# Patient Record
Sex: Female | Born: 1945 | Hispanic: No | Marital: Single | Smoking: Former smoker
Health system: Southern US, Community
[De-identification: ages and names within clinical notes are randomized; demographics above are authoritative.]

## PROBLEM LIST (undated history)

## (undated) DIAGNOSIS — IMO0002 Reserved for concepts with insufficient information to code with codable children: Secondary | ICD-10-CM

## (undated) HISTORY — DX: Reserved for concepts with insufficient information to code with codable children: IMO0002

---

## 2001-04-20 ENCOUNTER — Emergency Department (HOSPITAL_COMMUNITY): Admission: EM | Admit: 2001-04-20 | Discharge: 2001-04-20 | Payer: Self-pay | Admitting: Emergency Medicine

## 2012-02-25 ENCOUNTER — Other Ambulatory Visit: Payer: Self-pay | Admitting: Obstetrics and Gynecology

## 2012-02-25 DIAGNOSIS — Z1231 Encounter for screening mammogram for malignant neoplasm of breast: Secondary | ICD-10-CM

## 2012-03-04 ENCOUNTER — Ambulatory Visit (INDEPENDENT_AMBULATORY_CARE_PROVIDER_SITE_OTHER): Payer: Self-pay | Admitting: *Deleted

## 2012-03-04 ENCOUNTER — Ambulatory Visit (HOSPITAL_COMMUNITY)
Admission: RE | Admit: 2012-03-04 | Discharge: 2012-03-04 | Disposition: A | Discharge status: Home / Self Care | Payer: Self-pay | Source: Ambulatory Visit | Attending: Obstetrics and Gynecology | Admitting: Obstetrics and Gynecology

## 2012-03-04 VITALS — BP 117/71 | HR 70 | Temp 97.1°F | Ht 60.5 in | Wt 145.2 lb

## 2012-03-04 DIAGNOSIS — Z1231 Encounter for screening mammogram for malignant neoplasm of breast: Secondary | ICD-10-CM

## 2012-03-04 DIAGNOSIS — Z01419 Encounter for gynecological examination (general) (routine) without abnormal findings: Secondary | ICD-10-CM

## 2012-03-04 NOTE — Patient Instructions (Signed)
Taught patient how to perform BSE. Let her know [facility] will cover Pap smears every 3 years unless has a history of abnormal Pap smears. Patient is escorted to mammography for a screening mammogram. Patient referred to the [facility] for for cystocele. Appointment scheduled April 04, 2012 at 1115. Gave patient appointment and patient stated will be there. Let patient know will follow up with her within the next couple weeks with results. Patient verbalized understanding.

## 2012-03-04 NOTE — Progress Notes (Signed)
No complaints today.  Pap Smear:    Pap smear completed today. Per patient thinks last Pap smear was at the free Pap smear screening at the [facility] last year and was normal. Per patient no history of abnormal Pap smears. No Pap smear results in EPIC.  Physical exam: Breasts Breasts symmetrical. No skin abnormalities bilateral breasts. No nipple retraction bilateral breasts. No nipple discharge bilateral breasts. No lymphadenopathy. No lumps palpated bilateral breasts. No complaints of pain or tenderness on exam.          Pelvic/Bimanual   Ext Genitalia No lesions, no swelling and no discharge observed on external genitalia. Cystocele observed that at times requires that patient push back in.         Vagina Vagina pink and normal texture. No lesions or discharge observed in vagina.          Cervix Cervix is present. Cervix pink and of normal texture. No discharge observed at cervical os.     Uterus Uterus is present and palpable. Patient has large cystocele that referred to the [facility] for follow up. Appointment scheduled for April 04, 2012 at 1115.        Adnexae Bilateral ovaries present and palpable. No tenderness on palpation.          Rectovaginal No rectal exam completed today since patient had no rectal complaints. No skin abnormalities observed on exam.

## 2012-03-14 ENCOUNTER — Telehealth: Payer: Self-pay | Admitting: *Deleted

## 2012-03-14 NOTE — Telephone Encounter (Signed)
Telephoned home # disconnected. Pt unaware of colpo scheduled for May 2 at 1:00

## 2012-03-31 ENCOUNTER — Encounter: Payer: Self-pay | Admitting: Obstetrics and Gynecology

## 2012-04-04 ENCOUNTER — Encounter: Payer: Self-pay | Admitting: Obstetrics & Gynecology

## 2012-04-04 ENCOUNTER — Ambulatory Visit (INDEPENDENT_AMBULATORY_CARE_PROVIDER_SITE_OTHER): Payer: Self-pay | Admitting: Obstetrics & Gynecology

## 2012-04-04 VITALS — BP 104/56 | HR 61 | Temp 98.6°F | Ht 61.0 in | Wt 144.5 lb

## 2012-04-04 DIAGNOSIS — N393 Stress incontinence (female) (male): Secondary | ICD-10-CM

## 2012-04-04 DIAGNOSIS — N812 Incomplete uterovaginal prolapse: Secondary | ICD-10-CM

## 2012-04-04 MED ORDER — ESTROGENS, CONJUGATED 0.625 MG/GM VA CREA: TOPICAL_CREAM | VAGINAL | Status: DC

## 2012-04-04 NOTE — Progress Notes (Signed)
  Subjective:    Patient ID: Tiffany Armstrong, female    DOB: 07-23-1946, 66 y.o.   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 She has not been sexually active for "years".  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 She will need vaginal estrogen 2 times per week prior to pessary placement.

## 2012-04-10 ENCOUNTER — Encounter: Payer: Self-pay | Admitting: Family Medicine

## 2012-04-15 ENCOUNTER — Telehealth: Payer: Self-pay | Admitting: *Deleted

## 2012-04-15 NOTE — Telephone Encounter (Signed)
Attempted to call patient with interpreter Delorise Royals since patient no showed for colposcopy appointment on 04/10/12. Rescheduled patients appointment for Thursday, Apr 17, 2012 at 1345. No one answered phone. Left message for patient to call me back.

## 2012-04-16 ENCOUNTER — Telehealth: Payer: Self-pay | Admitting: *Deleted

## 2012-04-16 NOTE — Telephone Encounter (Signed)
Interpreter Tiffany Armstrong was able to get in touch with patient and give her follow up appointment at the [facility] for colposcopy due to missing previous appointment. Patient verbalized understanding.

## 2012-04-17 ENCOUNTER — Encounter: Payer: Self-pay | Admitting: Physician Assistant

## 2012-04-17 ENCOUNTER — Ambulatory Visit (INDEPENDENT_AMBULATORY_CARE_PROVIDER_SITE_OTHER): Payer: Self-pay | Admitting: Physician Assistant

## 2012-04-17 VITALS — BP 114/59 | HR 59 | Temp 97.1°F | Ht 61.0 in | Wt 145.6 lb

## 2012-04-17 DIAGNOSIS — R87811 Vaginal high risk human papillomavirus (HPV) DNA test positive: Secondary | ICD-10-CM

## 2012-04-17 DIAGNOSIS — N8111 Cystocele, midline: Secondary | ICD-10-CM

## 2012-04-17 DIAGNOSIS — IMO0002 Reserved for concepts with insufficient information to code with codable children: Secondary | ICD-10-CM

## 2012-04-17 DIAGNOSIS — N816 Rectocele: Secondary | ICD-10-CM | POA: Insufficient documentation

## 2012-04-17 HISTORY — DX: Reserved for concepts with insufficient information to code with codable children: IMO0002

## 2012-04-17 NOTE — Progress Notes (Signed)
Pt presents from [facility] fro ASCUS pap with +HPV. Patient given informed consent, signed copy in the chart, time out was performed.  Placed in lithotomy position. Cervix viewed with speculum and colposcope after application of acetic acid.   Colposcopy adequate?  No [place]et[place]hite lesions?Yes Punctation?No Mosaicism?  No Abnormal vasculature?  No Biopsies?12 o'clock ECC?Yes  COMMENTS:CIN I/HPV Patient was given post procedure instructions.  She will return as scheduled on 5/31 with Dr. Marice Potter for pessary fitting and results.

## 2012-04-17 NOTE — Patient Instructions (Signed)
Colposcopa, Cuidado posterior (Colposcopy, Care After) La colposcopa es un procedimiento en el que se utiliza una herramienta especial para magnificar la superficie del cuello del tero. Tambin es posible que se tome una muestra de tejido (biopsia). Esta muestra se observar para identificar la presencia de cncer cervical u otros problemas. Despus del procedimiento:  Podr sentir algunos clicos.   Recustese algunos minutos si se siente mareada.   Podr tener un sangrado que debera detenerse luego de algunos das.  CUIDADOS EN EL HOGAR  No tenga relaciones sexuales ni use tampones durante 2 o 3 das o segn le hayan indicado.   Slo tome medicamentos como lo indique su mdico.   Contine tomando las pastillas anticonceptivas de la forma habitual.  Averige los resultados de su anlisis Pregunte cundo estarn listos los resultados del examen. Asegrese de obtener los resultados. SOLICITE AYUDA DE INMEDIATO SI:  Tiene un sangrado abundante o elimina cogulos.   Su temperatura es de 102 F (38.9 C) o mayor.   Observa una secrecin vaginal anormal.   Tiene clicos que no se van con los medicamentos.   Siente mareos, vrtigo o pierde el conocimiento (se desmaya).  ASEGRESE DE QUE:   Comprende estas instrucciones.   Controlar su enfermedad.   Solicitar ayuda de inmediato si no mejora o si empeora.  Document Released: 12/29/2010 Document Revised: 11/15/2011 Exit[employer] Patient Information 2012 [employer]. 

## 2012-05-09 ENCOUNTER — Ambulatory Visit (INDEPENDENT_AMBULATORY_CARE_PROVIDER_SITE_OTHER): Payer: Self-pay | Admitting: Obstetrics and Gynecology

## 2012-05-09 ENCOUNTER — Encounter: Payer: Self-pay | Admitting: Obstetrics and Gynecology

## 2012-05-09 VITALS — BP 121/71 | HR 66 | Temp 98.0°F | Resp 12 | Ht 61.0 in | Wt 145.4 lb

## 2012-05-09 DIAGNOSIS — N760 Acute vaginitis: Secondary | ICD-10-CM

## 2012-05-09 DIAGNOSIS — N8111 Cystocele, midline: Secondary | ICD-10-CM

## 2012-05-09 DIAGNOSIS — IMO0002 Reserved for concepts with insufficient information to code with codable children: Secondary | ICD-10-CM

## 2012-05-09 LAB — WET PREP, GENITAL

## 2012-05-09 NOTE — Progress Notes (Signed)
Addended by: Catalina Antigua on: 05/09/2012 09:44 AM   Modules accepted: Orders

## 2012-05-09 NOTE — Progress Notes (Signed)
Patient ID: Tiffany Armstrong, female   DOB: 26-Apr-1946, 66 y.o.   MRN: [id number]  66 yo G3P3 with 3rd degree uterine prolapse and cystocele presenting today for pessary fitting. Patient has been using estrogen cream twice weekly since her 5/6 visit.   A size 2 3/4 ring pessary with support was placed successfully without complication. Patient was able to void without difficulty and was able to remove and re-insert pessary. Patient advised to continue weekly estrogen cream application.  RTC in 1 month

## 2012-05-11 MED ORDER — METRONIDAZOLE 500 MG PO TABS: 500.0000 mg | ORAL_TABLET | Freq: Two times a day (BID) | ORAL | Status: AC

## 2012-05-11 NOTE — Progress Notes (Signed)
Addended by: Catalina Antigua on: 05/11/2012 08:52 AM   Modules accepted: Orders

## 2012-05-12 ENCOUNTER — Telehealth: Payer: Self-pay | Admitting: *Deleted

## 2012-05-12 NOTE — Telephone Encounter (Signed)
Pt states that since having her pessary put in she is having worsening incontinence. I advised patient to take the pessary out and I would check with Dr. Jolayne Panther to see what she should do. Pt agrees and will call back if her problem becomes worse before she hears from [place].

## 2012-05-12 NOTE — Telephone Encounter (Signed)
Message copied by Mannie Stabile on Mon May 12, 2012 11:56 AM ------      Message from: Catalina Antigua      Created: Sun May 11, 2012  8:47 AM       Please inform patient that flagyl has ben e-prescribed for treatment of BV            Peggy

## 2012-05-12 NOTE — Telephone Encounter (Signed)
Called pt with interpreter Delorise Royals. Informed of results, she will pick up her medicine at pharmacy.

## 2012-05-14 NOTE — Telephone Encounter (Signed)
Patient may need to consider surgical treatment of her incontinence. If this is something she desires, she needs to schedule an appointment with Dr. Marice Potter.  If worsening incontinence persists, patient should come in to give in a urine sample for urine culture to rule out UTI.  Tiffany Armstrong

## 2012-05-15 NOTE — Telephone Encounter (Signed)
Called patient with Tiffany Armstrong, no answer so we left her a message to call back. She will call Raynelle Fanning and then Raynelle Fanning will call us.

## 2012-05-19 NOTE — Telephone Encounter (Signed)
Tiffany Armstrong spoke with patient  She doesn't want to have surgery right now, she will call us if her problem persists.

## 2012-06-05 ENCOUNTER — Ambulatory Visit (INDEPENDENT_AMBULATORY_CARE_PROVIDER_SITE_OTHER): Payer: Self-pay | Admitting: Family

## 2012-06-05 VITALS — BP 116/67 | HR 74 | Temp 97.5°F | Ht 61.0 in | Wt 146.1 lb

## 2012-06-05 DIAGNOSIS — R32 Unspecified urinary incontinence: Secondary | ICD-10-CM

## 2012-06-05 DIAGNOSIS — N760 Acute vaginitis: Secondary | ICD-10-CM

## 2012-06-05 LAB — POCT URINALYSIS DIP (DEVICE)
Bilirubin Urine: NEGATIVE
Glucose, UA: NEGATIVE mg/dL
Hgb urine dipstick: NEGATIVE
Specific Gravity, Urine: 1.01 (ref 1.005–1.030)

## 2012-06-05 MED ORDER — LIDOCAINE HCL 2 % EX GEL: CUTANEOUS | Status: AC | PRN

## 2012-06-05 NOTE — Progress Notes (Signed)
  Subjective:    Patient ID: Tiffany Armstrong, female    DOB: 09-26-1946, 66 y.o.   MRN: [id number]  HPI Pt is here with report of vaginal burning.  Pt recently fitted for a pessary.  Reports continued incontinence, however, less than before.  Do to discomfort, uses pessary for two days and then removes for two days, feels this works.  Reports using the vaginal estrogen as directed.  Also used flagyl for BV.   Review of Systems  Genitourinary: Positive for vaginal pain (burning).       Incontinence  All other systems reviewed and are negative.       Objective:   Physical Exam  Constitutional: She is oriented to person, place, and time. She appears well-developed and well-nourished. No distress.  HENT:  Head: Normocephalic and atraumatic.  Neck: Normal range of motion. Neck supple.  Abdominal: Bowel sounds are normal.  Genitourinary: There is no lesion on the right labia. There is no lesion on the left labia. No erythema or bleeding around the vagina. No vaginal discharge found.       No ulceration seen within the vaginal canal  Neurological: She is alert and oriented to person, place, and time.  Skin: Skin is warm and dry.          Assessment & Plan:  Vaginitis  Plan: Urine culture Wet prep RX Lidocaine Gel 2% Follow-up if no improvement or worsening of symptoms [facility]

## 2012-06-06 LAB — WET PREP, GENITAL
Clue Cells Wet Prep HPF POC: NONE SEEN
Trich, Wet Prep: NONE SEEN

## 2012-06-07 LAB — URINE CULTURE: Colony Count: >=100000

## 2012-06-14 ENCOUNTER — Other Ambulatory Visit: Payer: Self-pay | Admitting: Family

## 2012-06-14 MED ORDER — AMPICILLIN 250 MG PO CAPS: 500.0000 mg | ORAL_CAPSULE | Freq: Three times a day (TID) | ORAL | Status: AC

## 2012-06-19 ENCOUNTER — Telehealth: Payer: Self-pay

## 2012-06-19 NOTE — Telephone Encounter (Signed)
Called pt with Spanish interpreter, Raynelle Fanning, and spoke with pt daughter and daughter was gave Raynelle Fanning # [phone] to contact pt.  Contacted pt @ [phone] and informed pt of UTI and that a Rx was sent to her pharmacy. Verified pharmacy and pt stated that she would go to pick up Rx today.

## 2012-06-19 NOTE — Telephone Encounter (Signed)
Message copied by Faythe Casa on Thu Jun 19, 2012  8:17 AM ------      Message from: Melissa Noon      Created: Wed Jun 18, 2012  4:40 PM      Regarding: FW: RX sent to [facility] for UTI       Can you notify pt regarding RX sent for UTI.                                                                                                                                                      ----- Message -----         From: Kendrick Ranch, MD         Sent: 06/18/2012   1:42 PM           To: Melissa Noon, CNM      Subject: RE: RX sent to [facility] for UTI              Not my pt. Sending back to you      ----- Message -----         From: Melissa Noon, CNM         Sent: 06/14/2012   3:02 AM           To: [facility]      Subject: RX sent to [facility] for UTI                  Can you notify pt regarding RX sent for UTI.                  ----- Message -----         From: [facility]         Sent: 06/06/2012   5:46 AM           To: Melissa Noon, CNM

## 2012-11-03 ENCOUNTER — Encounter (HOSPITAL_COMMUNITY): Payer: Self-pay | Admitting: *Deleted

## 2012-11-18 ENCOUNTER — Encounter (HOSPITAL_COMMUNITY): Payer: Self-pay

## 2012-11-18 ENCOUNTER — Ambulatory Visit (HOSPITAL_COMMUNITY)
Admission: RE | Admit: 2012-11-18 | Discharge: 2012-11-18 | Disposition: A | Discharge status: Home / Self Care | Payer: Self-pay | Source: Ambulatory Visit | Attending: Obstetrics and Gynecology | Admitting: Obstetrics and Gynecology

## 2012-11-18 VITALS — BP 120/72 | Temp 98.9°F | Ht 62.0 in | Wt 146.4 lb

## 2012-11-18 DIAGNOSIS — Z01419 Encounter for gynecological examination (general) (routine) without abnormal findings: Secondary | ICD-10-CM

## 2012-11-18 NOTE — Addendum Note (Signed)
Encounter addended by: Lurlean Horns, LPN on: 16/09/9603  1:27 PM<BR>     Documentation filed: Visit Diagnoses, Orders

## 2012-11-18 NOTE — Patient Instructions (Signed)
Taught patient how to perform BSE and gave educational materials to take home. Let patient know that her next Pap smear and follow up will be based on the result of her Pap smear today.  Let patient know will follow up with her within the next couple weeks with results with an interpreter. Let patient know that her next mammogram is due 03/04/2013 and that she will need to come back to [facility] if still eligible for it to be covered through [facility]. Patient verbalized understanding.

## 2012-11-18 NOTE — Progress Notes (Signed)
No complaints today. Patient came to [facility] for 6 month follow up Pap smear.  Pap Smear:    Completed Pap smear today. Last Pap smear was 03/04/2012 at [facility] and ASCUS HPV +. Patient had a follow up colposcopy 04/17/2012 that showed benign cervical mucosa. Per patient that is the only abnormal Pap smear that she has had. Pap smear result above is in EPIC.        Pelvic/Bimanual   Ext Genitalia No lesions, no swelling and no discharge observed on external genitalia.         Vagina Vagina pink and normal texture. No lesions or discharge observed in vagina.          Cervix Cervix is present. Cervix pink and of normal texture.     Uterus Uterus is present and palpable. Patient has a cystocele that has been followed up at the [facility].      Adnexae Bilateral ovaries present and palpable. No tenderness on palpation.        Rectovaginal No rectal exam completed today since patient had no rectal complaints. No skin abnormalities observed on exam.

## 2013-02-12 ENCOUNTER — Other Ambulatory Visit: Payer: Self-pay | Admitting: Obstetrics and Gynecology

## 2013-02-12 DIAGNOSIS — Z1231 Encounter for screening mammogram for malignant neoplasm of breast: Secondary | ICD-10-CM

## 2013-02-25 ENCOUNTER — Encounter (HOSPITAL_COMMUNITY): Payer: Self-pay | Admitting: *Deleted

## 2013-03-10 ENCOUNTER — Ambulatory Visit (HOSPITAL_COMMUNITY)
Admission: RE | Admit: 2013-03-10 | Discharge: 2013-03-10 | Disposition: A | Discharge status: Home / Self Care | Payer: Self-pay | Source: Ambulatory Visit | Attending: Obstetrics and Gynecology | Admitting: Obstetrics and Gynecology

## 2013-03-10 ENCOUNTER — Encounter (HOSPITAL_COMMUNITY): Payer: Self-pay

## 2013-03-10 VITALS — BP 110/70 | Temp 98.0°F | Ht 62.0 in | Wt 143.6 lb

## 2013-03-10 DIAGNOSIS — Z1231 Encounter for screening mammogram for malignant neoplasm of breast: Secondary | ICD-10-CM

## 2013-03-10 DIAGNOSIS — Z1239 Encounter for other screening for malignant neoplasm of breast: Secondary | ICD-10-CM

## 2013-03-10 NOTE — Progress Notes (Signed)
No complaints today.  Pap Smear:    Pap smear not completed today. Last Pap smear was 11/18/2012 at [facility] and normal. Patient has a history of an abnormal Pap smear 03/04/2012 that was ASCUS HPV +. Patient had a colposcopy to follow up 04/17/2012 that did not show any dysplasia or malignancy. Pap smear and colposcopy results above is in EPIC.  Physical exam: Breasts Breasts symmetrical. No skin abnormalities bilateral breasts. No nipple retraction bilateral breasts. No nipple discharge bilateral breasts. No lymphadenopathy. No lumps palpated bilateral breasts. No complaints of pain or tenderness on exam. Patient escorted to mammography for a screening mammogram.        Pelvic/Bimanual No Pap smear completed today since last Pap smear was 11/18/2012. Pap smear not indicated per [facility] guidelines.

## 2013-03-10 NOTE — Patient Instructions (Signed)
Taught patient how to perform BSE. Patient did not need a Pap smear today due to last Pap smear was November 18, 2012. Told patient will need next Pap smear December 2014 since had an abnormal Pap smear March 2013. Told patient after 3 normal Pap smears in row that will need Pap smears every 3 years per [facility] and ACOG guidelines. Let patient know will follow up with her within the next couple weeks with results by letter or phone. Patient verbalized understanding. Patient escorted to mammography for a screening mammogram.

## 2013-03-27 ENCOUNTER — Emergency Department (HOSPITAL_COMMUNITY)
Admission: EM | Admit: 2013-03-27 | Discharge: 2013-03-27 | Disposition: A | Discharge status: Home Health | Payer: No Typology Code available for payment source | Source: Home / Self Care | Attending: Emergency Medicine | Admitting: Emergency Medicine

## 2013-03-27 ENCOUNTER — Encounter (HOSPITAL_COMMUNITY): Payer: Self-pay

## 2013-03-27 DIAGNOSIS — S025XXS Fracture of tooth (traumatic), sequela: Secondary | ICD-10-CM

## 2013-03-27 DIAGNOSIS — S0291XS Unspecified fracture of skull, sequela: Secondary | ICD-10-CM

## 2013-03-27 MED ORDER — IBUPROFEN 200 MG PO TABS: 600.0000 mg | ORAL_TABLET | Freq: Three times a day (TID) | ORAL | Status: DC | PRN

## 2013-03-27 MED ORDER — CLINDAMYCIN HCL 300 MG PO CAPS: 300.0000 mg | ORAL_CAPSULE | Freq: Three times a day (TID) | ORAL | Status: DC

## 2013-03-27 NOTE — Progress Notes (Signed)
Patient Demographics  Tiffany Armstrong, is a 67 y.o. female  ZOX:[id number]  WJX:[id number]  DOB - 1946/03/07  Chief Complaint  Patient presents with  . Dental Pain        Subjective:   Tiffany Armstrong today is here for evaluation following a left incision at the fracture and subsequent swelling and pain of her left gingival area. Patient has No headache, No chest pain, No abdominal pain - No Nausea, No new weakness tingling or numbness, No Cough - SOB. Denies any fever  Objective:    Filed Vitals:   03/27/13 1609  BP: 122/59  Pulse: 77  Temp: 97.6 F (36.4 C)  TempSrc: Oral  Resp: 18  SpO2: 98%     ALLERGIES:  No Known Allergies  PAST MEDICAL HISTORY: Past Medical History  Diagnosis Date  . Cystocele 04/17/2012  . 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 No pertinent past surgical history.  FAMILY HISTORY: Family History  Problem Relation Age of Onset  . Asthma Mother     MEDICATIONS AT HOME: Prior to Admission medications   Medication Sig Start Date End Date Taking? Authorizing Provider  clindamycin (CLEOCIN) 300 MG capsule Take 1 capsule (300 mg total) by mouth 3 (three) times daily. 03/27/13   Shanker Levora Dredge, MD  conjugated estrogens (PREMARIN) vaginal cream 1 gram per vagina 2 times per week 04/04/12   Allie Bossier, MD  ibuprofen (ADVIL) 200 MG tablet Take 3 tablets (600 mg total) by mouth every 8 (eight) hours as needed for pain. 03/27/13   Shanker Levora Dredge, MD  lidocaine (XYLOCAINE) 2 % jelly Apply topically as needed. 06/05/12 06/05/13  Melissa Noon, CNM  Multiple Vitamin (MULTIVITAMIN) capsule Take 1 capsule by mouth daily.    Historical Provider, MD    REVIEW OF SYSTEMS:  Constitutional:   No   Fevers, chills, fatigue.  HEENT:    No headaches, Sore throat,   Cardio-vascular: No chest pain,  Orthopnea, swelling in lower extremities, anasarca, palpitations  GI:  No  abdominal pain, nausea, vomiting, diarrhea  Resp: No shortness of breath,  No coughing up of blood.No cough.No wheezing.  Skin:  no rash or lesions.  GU:  no dysuria, change in color of urine, no urgency or frequency.  No flank pain.  Musculoskeletal: No joint pain or swelling.  No decreased range of motion.  No back pain.  Psych: No change in mood or affect. No depression or anxiety.  No memory loss.   Exam  General appearance :Awake, alert, not in any distress. Speech Clear. Not toxic Looking HEENT: Atraumatic and Normocephalic, pupils equally reactive to light and accomodation Neck: supple, no JVD. No cervical lymphadenopathy.  Chest:Good air entry bilaterally, no added sounds  CVS: S1 S2 regular, no murmurs.  Abdomen: Bowel sounds present, Non tender and not distended with no gaurding, rigidity or rebound. Extremities: B/L Lower Ext shows no edema, both legs are warm to touch Neurology: Awake alert, and oriented X 3, CN II-XII intact, Non focal Skin:No Rash Wounds:N/A    Data Review   CBC No results found for this basename: WBC, HGB, HCT, PLT, MCV, MCH, MCHC, RDW, NEUTRABS, LYMPHSABS, MONOABS, EOSABS, BASOSABS, BANDABS, BANDSABD,  in the last 168 hours  Chemistries   No results found for this basename: NA, K, CL, CO2, GLUCOSE, BUN, CREATININE, GFRCGP, CALCIUM, MG, AST, ALT, ALKPHOS, BILITOT,  in the last 168 hours ------------------------------------------------------------------------------------------------------------------ No results  found for this basename: HGBA1C,  in the last 72 hours ------------------------------------------------------------------------------------------------------------------ No results found for this basename: CHOL, HDL, LDLCALC, TRIG, CHOLHDL, LDLDIRECT,  in the last 72 hours ------------------------------------------------------------------------------------------------------------------ No results found for this basename: TSH,  T4TOTAL, FREET3, T3FREE, THYROIDAB,  in the last 72 hours ------------------------------------------------------------------------------------------------------------------ No results found for this basename: VITAMINB12, FOLATE, FERRITIN, TIBC, IRON, RETICCTPCT,  in the last 72 hours  Coagulation profile  No results found for this basename: INR, PROTIME,  in the last 168 hours    Assessment & Plan   1. Left incisor tooth fracture/gingivitis - Some swelling in tenderness in the left gingival area. - Will start empiric clindamycin and ibuprofen - Referral to the dental clinic- sooner the better-RN aware that patient needs referral  Patient in follow with [place] when necessary  Follow-up Information   Schedule an appointment as soon as possible for a visit with [facility]. (As needed)

## 2013-03-27 NOTE — ED Notes (Signed)
Patient complains of having gum pain Needs dental referral

## 2013-03-27 NOTE — ED Notes (Signed)
Referral faxed to [facility] 

## 2013-07-23 ENCOUNTER — Ambulatory Visit: Payer: No Typology Code available for payment source | Attending: Family Medicine | Admitting: Internal Medicine

## 2013-07-23 ENCOUNTER — Encounter: Payer: Self-pay | Admitting: Internal Medicine

## 2013-07-23 VITALS — BP 105/69 | HR 83 | Temp 98.6°F | Resp 16 | Ht 61.0 in | Wt 132.2 lb

## 2013-07-23 DIAGNOSIS — Z7189 Other specified counseling: Secondary | ICD-10-CM

## 2013-07-23 DIAGNOSIS — Z7689 Persons encountering health services in other specified circumstances: Secondary | ICD-10-CM

## 2013-07-23 DIAGNOSIS — Z01419 Encounter for gynecological examination (general) (routine) without abnormal findings: Secondary | ICD-10-CM | POA: Insufficient documentation

## 2013-07-23 LAB — CBC WITH DIFFERENTIAL/PLATELET
Basophils Absolute: 0 10*3/uL (ref 0.0–0.1)
HCT: 37.7 % (ref 36.0–46.0)
Lymphocytes Relative: 40 % (ref 12–46)
Monocytes Absolute: 0.4 10*3/uL (ref 0.1–1.0)
Neutro Abs: 2.9 10*3/uL (ref 1.7–7.7)
Neutrophils Relative %: 50 % (ref 43–77)
Platelets: 351 10*3/uL (ref 150–400)
RDW: 13.9 % (ref 11.5–15.5)
WBC: 5.8 10*3/uL (ref 4.0–10.5)

## 2013-07-23 MED ORDER — ESOMEPRAZOLE MAGNESIUM 40 MG PO CPDR: 40.0000 mg | DELAYED_RELEASE_CAPSULE | Freq: Every day | ORAL | Status: DC

## 2013-07-23 NOTE — Progress Notes (Signed)
PT HERE TO ESTABLISH CARE  NO KNOWN MEDICAL HX NOTED NEED ANNUAL PHYSICAL/BLOOD WORK C/O MILD R BUTTOCK ACHY PAIN SPEAKS ONLY SPANISH.INTEPRETOR PRESENT

## 2013-07-23 NOTE — Progress Notes (Signed)
Patient ID: Tiffany Armstrong, female   DOB: 11-12-1946, 67 y.o.   MRN: [id number]  CC:  HPI: 66 year old female who presents to establish care. Patient has a history of abnormal Pap smear 03/04/2012 that was ASCUS HPV +. Patient had a colposcopy to follow up 04/17/2012 that did not show any dysplasia or malignancy.  last Pap smear was 11/18/2012. Pap smear not indicated per [facility] guidelines until end of the year She denies any chest pain any shortness of breath any recent weight loss or weight gain. She was to receive age-appropriate screening She states that she had a mammogram done this year her on 02/12/13. It was negative.  She has never had a colonoscopy   No Known Allergies Past Medical History  Diagnosis Date  . Cystocele 04/17/2012  . 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 Multiple Vitamin (MULTIVITAMIN) capsule Take 1 capsule by mouth daily.      . clindamycin (CLEOCIN) 300 MG capsule Take 1 capsule (300 mg total) by mouth 3 (three) times daily.  15 capsule  0  . conjugated estrogens (PREMARIN) vaginal cream 1 gram per vagina 2 times per week  42.5 g  12  . ibuprofen (ADVIL) 200 MG tablet Take 3 tablets (600 mg total) by mouth every 8 (eight) hours as needed for pain.  30 tablet  0   No current facility-administered medications on file prior to visit.   Family History  Problem Relation Age of Onset  . Asthma Mother   . Alcohol abuse Father   . Heart disease Maternal Grandmother    History   Social History  . Marital Status: Single    Spouse Name: N/A    Number of Children: N/A  . Years of Education: N/A   Occupational History  . Not on file.   Social History Main Topics  . Smoking status: Former Smoker    Types: Cigarettes    Quit date: 07/24/1999  . Smokeless tobacco: Not on file  . Alcohol Use: No  . Drug Use: No  . Sexual Activity: Not Currently   Birth Control/ Protection: None   Other Topics Concern  . Not on file   Social History Narrative  . No narrative on file    Review of Systems  Constitutional: Negative for fever, chills, diaphoresis, activity change, appetite change and fatigue.  HENT: Negative for ear pain, nosebleeds, congestion, facial swelling, rhinorrhea, neck pain, neck stiffness and ear discharge.   Eyes: Negative for pain, discharge, redness, itching and visual disturbance.  Respiratory: Negative for cough, choking, chest tightness, shortness of breath, wheezing and stridor.   Cardiovascular: Negative for chest pain, palpitations and leg swelling.  Gastrointestinal: Negative for abdominal distention.  Genitourinary: Negative for dysuria, urgency, frequency, hematuria, flank pain, decreased urine volume, difficulty urinating and dyspareunia.  Musculoskeletal: Negative for back pain, joint swelling, arthralgias and gait problem.  Neurological: Negative for dizziness, tremors, seizures, syncope, facial asymmetry, speech difficulty, weakness, light-headedness, numbness and headaches.  Hematological: Negative for adenopathy. Does not bruise/bleed easily.  Psychiatric/Behavioral: Negative for hallucinations, behavioral problems, confusion, dysphoric mood, decreased concentration and agitation.    Objective:   Filed Vitals:   07/23/13 1702  BP: 105/69  Pulse: 83  Temp: 98.6 F (37 C)  Resp: 16    Physical Exam  Constitutional: Appears well-developed and well-nourished. No distress.  HENT: Normocephalic. External right and left ear  normal. Oropharynx is clear and moist.  Eyes: Conjunctivae and EOM are normal. PERRLA, no scleral icterus.  Neck: Normal ROM. Neck supple. No JVD. No tracheal deviation. No thyromegaly.  CVS: RRR, S1/S2 +, no murmurs, no gallops, no carotid bruit.  Pulmonary: Effort and breath sounds normal, no stridor, rhonchi, wheezes, rales.  Abdominal: Soft. BS +,  no distension, tenderness,  rebound or guarding.  Musculoskeletal: Normal range of motion. No edema and no tenderness.  Lymphadenopathy: No lymphadenopathy noted, cervical, inguinal. Neuro: Alert. Normal reflexes, muscle tone coordination. No cranial nerve deficit. Skin: Skin is warm and dry. No rash noted. Not diaphoretic. No erythema. No pallor.  Psychiatric: Normal mood and affect. Behavior, judgment, thought content normal.   No results found for this basename: WBC, HGB, HCT, MCV, PLT   No results found for this basename: CREATININE, BUN, NA, K, CL, CO2    No results found for this basename: HGBA1C   Lipid Panel  No results found for this basename: chol, trig, hdl, cholhdl, vldl, ldlcalc       Assessment and plan:   Patient Active Problem List   Diagnosis Date Noted  . Cystocele 04/17/2012  . Rectocele 04/17/2012  . 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

## 2013-07-24 LAB — COMPREHENSIVE METABOLIC PANEL
ALT: 11 U/L (ref 0–35)
AST: 14 U/L (ref 0–37)
Albumin: 4.5 g/dL (ref 3.5–5.2)
Calcium: 9.4 mg/dL (ref 8.4–10.5)
Chloride: 101 mEq/L (ref 96–112)
Potassium: 4.1 mEq/L (ref 3.5–5.3)
Sodium: 137 mEq/L (ref 135–145)
Total Protein: 7.3 g/dL (ref 6.0–8.3)

## 2013-07-24 LAB — LIPID PANEL
LDL Cholesterol: 153 mg/dL — ABNORMAL HIGH (ref 0–99)
VLDL: 18 mg/dL (ref 0–40)

## 2013-07-24 LAB — HEMOGLOBIN A1C: Mean Plasma Glucose: 111 mg/dL (ref <117)

## 2013-08-06 ENCOUNTER — Ambulatory Visit: Payer: No Typology Code available for payment source | Attending: Internal Medicine | Admitting: Internal Medicine

## 2013-08-06 NOTE — Progress Notes (Signed)
PT HERE FOR LAB RESULTS

## 2013-08-31 ENCOUNTER — Ambulatory Visit: Payer: No Typology Code available for payment source | Attending: Internal Medicine

## 2013-08-31 VITALS — BP 123/84 | HR 67 | Temp 98.5°F | Resp 14

## 2013-08-31 DIAGNOSIS — Z23 Encounter for immunization: Secondary | ICD-10-CM

## 2013-08-31 DIAGNOSIS — Z09 Encounter for follow-up examination after completed treatment for conditions other than malignant neoplasm: Secondary | ICD-10-CM | POA: Insufficient documentation

## 2013-08-31 DIAGNOSIS — Z299 Encounter for prophylactic measures, unspecified: Secondary | ICD-10-CM

## 2013-08-31 NOTE — Progress Notes (Unsigned)
Pt here for flu vaccine. Denies allergies. Instructions given

## 2013-12-01 ENCOUNTER — Ambulatory Visit (HOSPITAL_COMMUNITY): Payer: Self-pay

## 2013-12-22 ENCOUNTER — Ambulatory Visit (HOSPITAL_COMMUNITY)
Admission: RE | Admit: 2013-12-22 | Discharge: 2013-12-22 | Disposition: A | Discharge status: Home / Self Care | Payer: Self-pay | Source: Ambulatory Visit | Attending: Obstetrics and Gynecology | Admitting: Obstetrics and Gynecology

## 2013-12-22 ENCOUNTER — Encounter (INDEPENDENT_AMBULATORY_CARE_PROVIDER_SITE_OTHER): Payer: Self-pay

## 2013-12-22 ENCOUNTER — Other Ambulatory Visit: Payer: Self-pay | Admitting: Obstetrics and Gynecology

## 2013-12-22 ENCOUNTER — Encounter (HOSPITAL_COMMUNITY): Payer: Self-pay

## 2013-12-22 VITALS — BP 102/60 | Temp 98.0°F | Ht 61.0 in | Wt 128.4 lb

## 2013-12-22 DIAGNOSIS — Z01419 Encounter for gynecological examination (general) (routine) without abnormal findings: Secondary | ICD-10-CM

## 2013-12-22 DIAGNOSIS — N898 Other specified noninflammatory disorders of vagina: Secondary | ICD-10-CM

## 2013-12-22 NOTE — Addendum Note (Signed)
Encounter addended by: Lurlean HornsSabrina Bryan Goin Addysin Porco, LPN on: 4/01/02721/13/2015  3:01 PM<BR>     Documentation filed: Visit Diagnoses, Orders

## 2013-12-22 NOTE — Patient Instructions (Signed)
Informed patient that if her Pap smear today is normal that her next Pap smear will be due in 1 year due to her history of an abnormal Pap smear March 2013 and this is her second Pap smear since. Patient has an uterine prolapse. Offered to refer patient for follow up but patient refuses to follow up at this time. Let patient know will follow up with her within the next couple weeks with results of wet prep and Pap smear by letter or phone. Tiffany Armstrong verbalized understanding.  Fusako Tanabe, Kathaleen Maserhristine Poll, RN @T @ 10:51 AM

## 2013-12-22 NOTE — Progress Notes (Signed)
Complained of vaginal discharge and odor.  Pap Smear:  Pap smear completed today. [facility]18[facility] and normal. Patient has a history of an abnormal Pap smear 03/04/2012 that was ASCUS HPV +. Patient had a colposcopy to follow up 04/17/2012 that did not show any dysplasia or malignancy. Pap smear and colposcopy results above are in EPIC.    Pelvic/Bimanual   Ext Genitalia No lesions, no swelling and no discharge observed on external genitalia. Cystocele observed that at times requires that patient push back in.   Vagina  Vagina pink and normal texture. No lesions and small amount of yellow colored discharge observed in vagina. Wet prep completed.  Cervix  Cervix is present. Cervix pink and of normal texture. Small amount of yellow colored discharge observed at cervical os.   Uterus  Uterus is present and palpable. Patient has large cystocele are refuses to follow up at this time.  Adnexae  Bilateral ovaries present and palpable. No tenderness on palpation.   Rectovaginal  No rectal exam completed today since patient had no rectal complaints. No skin abnormalities observed on exam.

## 2013-12-23 LAB — WET PREP, GENITAL
CLUE CELLS WET PREP: NONE SEEN
TRICH WET PREP: NONE SEEN
Yeast Wet Prep HPF POC: NONE SEEN

## 2013-12-24 ENCOUNTER — Telehealth (HOSPITAL_COMMUNITY): Payer: Self-pay | Admitting: *Deleted

## 2013-12-24 NOTE — Telephone Encounter (Signed)
Telephoned patient at home # and discussed negative pap smear results. Next pap smear due in one year. Wet prep was also normal. Patient voiced understanding. Used interpreter Delorise RoyalsJulie Sowell.

## 2014-03-17 ENCOUNTER — Emergency Department (HOSPITAL_COMMUNITY): Payer: Self-pay

## 2014-03-17 DIAGNOSIS — J069 Acute upper respiratory infection, unspecified: Secondary | ICD-10-CM | POA: Insufficient documentation

## 2014-03-17 DIAGNOSIS — H9190 Unspecified hearing loss, unspecified ear: Secondary | ICD-10-CM | POA: Insufficient documentation

## 2014-03-17 DIAGNOSIS — Z87891 Personal history of nicotine dependence: Secondary | ICD-10-CM | POA: Insufficient documentation

## 2014-03-17 DIAGNOSIS — H9209 Otalgia, unspecified ear: Secondary | ICD-10-CM | POA: Insufficient documentation

## 2014-03-17 DIAGNOSIS — J329 Chronic sinusitis, unspecified: Secondary | ICD-10-CM | POA: Insufficient documentation

## 2014-03-17 DIAGNOSIS — Z8742 Personal history of other diseases of the female genital tract: Secondary | ICD-10-CM | POA: Insufficient documentation

## 2014-03-17 DIAGNOSIS — H9210 Otorrhea, unspecified ear: Secondary | ICD-10-CM | POA: Insufficient documentation

## 2014-03-17 LAB — CBC WITH DIFFERENTIAL/PLATELET
Basophils Absolute: 0 10*3/uL (ref 0.0–0.1)
Basophils Relative: 0 % (ref 0–1)
EOS PCT: 1 % (ref 0–5)
Eosinophils Absolute: 0.2 10*3/uL (ref 0.0–0.7)
HEMATOCRIT: 37.1 % (ref 36.0–46.0)
Hemoglobin: 13 g/dL (ref 12.0–15.0)
LYMPHS ABS: 1.7 10*3/uL (ref 0.7–4.0)
LYMPHS PCT: 15 % (ref 12–46)
MCH: 33 pg (ref 26.0–34.0)
MCHC: 35 g/dL (ref 30.0–36.0)
MCV: 94.2 fL (ref 78.0–100.0)
MONO ABS: 1.1 10*3/uL — AB (ref 0.1–1.0)
Monocytes Relative: 10 % (ref 3–12)
Neutro Abs: 8.1 10*3/uL — ABNORMAL HIGH (ref 1.7–7.7)
Neutrophils Relative %: 74 % (ref 43–77)
Platelets: 366 10*3/uL (ref 150–400)
RBC: 3.94 MIL/uL (ref 3.87–5.11)
RDW: 13.4 % (ref 11.5–15.5)
WBC: 11.1 10*3/uL — AB (ref 4.0–10.5)

## 2014-03-17 LAB — COMPREHENSIVE METABOLIC PANEL
ALBUMIN: 3.3 g/dL — AB (ref 3.5–5.2)
ALK PHOS: 76 U/L (ref 39–117)
ALT: 8 U/L (ref 0–35)
AST: 17 U/L (ref 0–37)
BUN: 9 mg/dL (ref 6–23)
CO2: 27 mEq/L (ref 19–32)
Calcium: 9.1 mg/dL (ref 8.4–10.5)
Chloride: 101 mEq/L (ref 96–112)
Creatinine, Ser: 0.65 mg/dL (ref 0.50–1.10)
GFR calc Af Amer: >90 mL/min (ref >90)
GFR calc non Af Amer: >90 mL/min (ref >90)
Glucose, Bld: 110 mg/dL — ABNORMAL HIGH (ref 70–99)
POTASSIUM: 4.6 meq/L (ref 3.7–5.3)
SODIUM: 141 meq/L (ref 137–147)
TOTAL PROTEIN: 7.8 g/dL (ref 6.0–8.3)
Total Bilirubin: 0.4 mg/dL (ref 0.3–1.2)

## 2014-03-17 NOTE — ED Notes (Signed)
Pt c/o cold symptoms for two weeks but symptoms got really bad on Monday. Pt and son at bedside states she blew her nose, ears popped, now she has drainage from both ears. Pt reports decrease in fluid and food intake. Pt denies n/v/d and cough.

## 2014-03-18 ENCOUNTER — Encounter (HOSPITAL_COMMUNITY): Payer: Self-pay | Admitting: Emergency Medicine

## 2014-03-18 ENCOUNTER — Emergency Department (HOSPITAL_COMMUNITY)
Admission: EM | Admit: 2014-03-18 | Discharge: 2014-03-18 | Disposition: A | Discharge status: Home / Self Care | Payer: Self-pay | Attending: Emergency Medicine | Admitting: Emergency Medicine

## 2014-03-18 DIAGNOSIS — H9209 Otalgia, unspecified ear: Secondary | ICD-10-CM

## 2014-03-18 DIAGNOSIS — J329 Chronic sinusitis, unspecified: Secondary | ICD-10-CM

## 2014-03-18 DIAGNOSIS — J069 Acute upper respiratory infection, unspecified: Secondary | ICD-10-CM

## 2014-03-18 DIAGNOSIS — H9210 Otorrhea, unspecified ear: Secondary | ICD-10-CM

## 2014-03-18 MED ORDER — OXYMETAZOLINE HCL 0.05 % NA SOLN
1.0000 | Freq: Two times a day (BID) | NASAL | Status: DC
Start: 2014-03-18 — End: 2016-07-29

## 2014-03-18 MED ORDER — ACETAMINOPHEN-CODEINE #3 300-30 MG PO TABS
1.0000 | ORAL_TABLET | Freq: Once | ORAL | Status: AC
  Administered 2014-03-18: 1 via ORAL
  Filled 2014-03-18: qty 1

## 2014-03-18 MED ORDER — ANTIPYRINE-BENZOCAINE 5.4-1.4 % OT SOLN: 3.0000 [drp] | OTIC | Status: DC | PRN

## 2014-03-18 MED ORDER — IBUPROFEN 400 MG PO TABS: 400.0000 mg | ORAL_TABLET | Freq: Four times a day (QID) | ORAL | Status: DC | PRN

## 2014-03-18 MED ORDER — AMOXICILLIN 500 MG PO CAPS: 500.0000 mg | ORAL_CAPSULE | Freq: Three times a day (TID) | ORAL | Status: DC

## 2014-03-18 NOTE — Discharge Instructions (Signed)
Please take the medications prescribed for ear pain, sinus congestion and headaches. Antibiotics should help as well. Return to the ER if the symptoms get worse.   Otalgia (Otalgia) Usted o su nio sienten dolor en el odo. La causa ms frecuente es la infeccin del odo medio. El dolor aparece debido a la acumulacin de lquido y a la presin detrs del tmpano. El dolor puede ser agudo, sordo o intenso. Puede ser transitorio o constante. El odo medio est conectado a los conductos nasales por un corto tubo denominado trompa de [place]. Las trompas de EcolabEustaquio permiten que el lquido drene Portugalhacia afuera del odo medio y [place] Grenadinesayuda a Pharmacologistmantener equilibrada la presin del odo . CAUSAS Un resfro o alergia pueden bloquear las trompas de [place]ustaquio debido a la inflamacin y a Haematologistla acumulacin de Administratorsecreciones. Esto es especialmente probable en los nios pequeos debido a que sus trompas son ms cortas y se Ambulance personencuentran en una posicin ms horizontal. Cuando las trompas de MasuryEustaquio se obstruyen, se detiene el flujo normal de lquido que proviene del odo. El lquido se acumula y causa rigidez, Engineer, miningdolor, prdida [place] Kitt[place] and Nevisauditiva e infeccin si desarrollan grmenes. SNTOMAS Los sntomas de infeccin en el odo son Grant Rutsfiebre, dolor, nerviosismo, aumento del llanto e irritabilidad. Muchos nios sufrirn una prdida [place] Kitts and Nevisauditiva menor y temporaria durante la infeccin e inmediatamente despus. La prdida Constellation Energyauditiva permanente no es frecuente pero el riesgo aumenta si el nio sufre muchas infecciones. Otra de las causas es la retencin de agua en el canal auditivo externo debido a la natacin o al bao. En los adultos es menos probable que la causa del dolor sea una infeccin. El dolor de odos puede provenir de otras zonas. En algunos casos existe un problema en la articulacin que se encuentra entre la [place] Grovemandbula y el crneo. Tambin puede provenir de los dientes o el cuello. Otras causas son:  Cuerpo extrao en el odo.  Infeccin en  el odo externo.  Sinusitis.  Tapn de cera.  Traumatismo.  Artritis de Architectural technologistla mandbula o trastornos de la articulacin temporomandibular.  Infeccin en el odo medio  Infecciones dentales.  Dolor de garganta con dolor de odos. DIAGNSTICO Generalmente el profesional realiza el diagnstico a travs de Nurse, learning disabilityun examen. En algunos casos ser necesario realizar estudios especiales, como radiografas o Point Blankanlisis. TRATAMIENTO  Si le han prescripto antibiticos, selos segn las indicaciones y tmelos hasta completarlos an si los sntomas parecen haber mejorado.  En algunos nios ser necesario colocar tubos de ecualizacin de presin. stos tubos son pequeos conductos plsticos que se colocan en el tmpano en un procedimiento quirrgico simple. Ellos permiten que el lquido drene ms fcilmente y Regulatory affairs officerecualizar la presin del odo medio. Como consecuencia se Printmakeralivia el dolor ocasionado por los cambios de presin. INSTRUCCIONES PARA EL CUIDADO DOMICILIARIO  Utilice los medicamentos de venta libre o de prescripcin para Chief Technology Officerel dolor, el malestar o la Ridgewayfiebre, segn se lo indique el profesional que lo asiste. NO LE ADMINISTRE ASPIRINA A SU NIO porque existe el riesgo de contraer el [place].  Aplique compresas frias en el odo externo durante 15 a 20 minutos, 3 a 4 veces por da, o segn lo necesite para Engineer, materialsaliviar el dolor. No aplique hielo directamente sobre la piel. Puede congelar la piel.  Las gotas de venta libre utilizadas segn las indicaciones pueden ser efectivas. En algunos Nurse, adultcasos el profesional le prescribir gotas ticas.  Descansar en posicin erguida puede ayudar a reducir la presin en el odo medio y a Engineer, materialsaliviar el dolor.  El dolor de odos causado por un rpido descenso desde altitudes elevadas puede aliviarse tragando o [employer]. Haga que el beb succione la mamadera durante los viajes en avin.  No fume dentro de la casa o cerca de los nios. Si no puede abandonar el hbito, fume  en el exterior.  Controle las [place]. SOLICITE ATENCIN MDICA DE INMEDIATO SI:  Usted o el nio se sienten enfermos.  No consigue Engineer, materials con la medicacin.  Sus sntomas o los del nio (dolor, fiebre o irritabilidad) no mejoran dentro de 24 a 48 hs o segn las indicaciones.  Siente un dolor intenso y se detiene bruscamente. Esto puede indicar la ruptura del tmpano.  Usted o el nio desarrollan nuevos problemas, como dolor de cabeza intenso, rigidez en el cuello, dificultad para tragar o hinchazn del rostro o en la zona que rodea el odo. Document Released: 03/04/2008 Document Revised: 02/18/2012 [facility] Patient Information 2014 [place], [place].  Sinusitis  (Sinusitis) La sinusitis es el enrojecimiento, sensibilidad e hinchazn (inflamacin) de los senos paranasales. Los senos paranasales son bolsas de aire que se encuentran dentro de los huesos de la cara (por debajo de los ojos, en la mitad de la frente o por encima de los ojos). En los senos paranasales sanos, el moco puede drenar y el aire circula a travs de ellos en su camino hacia la Clinical cytogeneticist. Sin embargo, cuando se [place], el moco y el aire [place]. Esto hace que se desarrollen bacterias y otros grmenes y originen una infeccin.   La sinusitis puede desarrollarse rpidamente y durar slo un tiempo corto (aguda) o continuar por un perodo largo (crnica). La sinusitis que dura ms de 12 semanas se considera crnica.  CAUSAS  Las causas de la sinusitis son:   Development worker, community estructurales, como el desplazamiento del cartlago que separa las fosas nasales (desvo del tabique) pueden disminuir el flujo de aire por la nariz y los senos paranasales y Audiological scientist su drenaje.  Las alteraciones funcionales, como cuando los pequeos pelos (cilias) que se encuentran en los senos nasales y que ayudan a eliminar la mucosidad no funcionan correctamente o no estn presentes. SNTOMAS  Los sntomas de sinusitis aguda  y crnica son los mismos. Los sntomas principales son Chief Technology Officer y la presin alrededor de los senos paranasales afectados. Otros sntomas son:   Careers information officer.  Dolor de odos.  Dolor de [place].  Mal aliento.  Disminucin del sentido del olfato y del gusto.  Tos, que empeora al [employer]  Fatiga.  Grant Ruts.  Drenaje de moco espeso por la nariz, que generalmente es de color verde y puede contener pus (purulento).  Hinchazn y calor en los senos paranasales afectados. DIAGNSTICO  El mdico le har un examen fsico. Durante el examen, el mdico:   Revisar su nariz buscando signos de crecimientos anormales en las fosas nasales (plipos nasales).  Palpar los senos paranasales afectados para buscar signos de infeccin.  Observar el interior de los senos paranasales (endoscopa) con un dispositivo especial que emite luz (endoscopio) colocndolo dentro de los senos paranasales. Si el mdico sospecha que usted sufre sinusitis crnica, podr indicar una o ms de las siguientes pruebas:   Pruebas de Programmer, multimedia.  Cultivo de secreciones nasales: tomar Lauris Poag del moco nasal y la enviar a un laboratorio para detectar bacterias.  Citologa nasal: el mdico tomar [place] de moco de la nariz para determinar si la sinusitis que usted sufre est relacionada con [place].  TRATAMIENTO  La mayora de los casos de sinusitis aguda se deben a una infeccin viral y se resuelven espontneamente dentro de los [address]. En algunos casos se recetan medicamentos para [employer] (analgsicos, descongestivos, aerosoles nasales con corticoides o aerosoles salinos).  Sin embargo, para la sinusitis por infeccin bacteriana, Chief Operating Officer. Los antibiticos son medicamentos que destruyen las bacterias que causan la infeccin.  Rara vez la sinusitis tiene su origen en una infeccin por hongos. En estos casos, Actor un medicamento  antifngico.  Para algunos casos de sinusitis crnica, es necesario someterse a [place]. Generalmente se trata de [employer] la sinusitis se repite ms de 3 veces al ao, a pesar de otros tratamientos.  INSTRUCCIONES PARA EL CUIDADO EN EL HOGAR   Tiene que beber gran cantidad de agua. Los lquidos ayudan a Optometrist moco para que drene ms fcilmente de los senos paranasales.  Use un humidificador.  Inhale vapor de 3 a 4 veces al da (por ejemplo, sintese en el bao con la ducha abierta).  Aplique un pao tibio y hmedo en su cara 3  4 veces al da, o segn las indicaciones de su mdico.  Use un aerosol nasal salino para ayudar a Environmental education officer y [employer] senos nasales.  Tome medicamentos de venta libre o recetados para Acupuncturist, Environmental health practitioner o la fiebre slo segn las indicaciones de su mdico. SOLICITE ATENCIN MDICA DE INMEDIATO SI:   Siente ms dolor o sufre dolores de cabeza intensos.  Tiene nuseas, vmitos o somnolencia.  Observa hinchazn alrededor del rostro.  Tiene problemas de visin.  Presenta rigidez en el cuello.  Tiene dificultad para respirar. ASEGRESE DE QUE:   Comprende estas instrucciones.  Controlar su enfermedad.  Recibir ayuda de inmediato si no mejora o si empeora. Document Released: 09/05/2005 Document Revised: 07/29/2013 [facility] Patient Information 2014 [place], [place].

## 2014-03-18 NOTE — ED Notes (Signed)
Explained delay to patient and family. RN explained that there was a critical patient in the department and that the MD was working to get to them as quickly as possible.

## 2014-03-18 NOTE — ED Provider Notes (Signed)
CSN: [id number]     Arrival date & time 03/17/14  2035 History   First MD Initiated Contact with Patient 03/18/14 [phone]     Chief Complaint  Patient presents with  . URI     (Consider location/radiation/quality/duration/timing/severity/associated sxs/prior Treatment) HPI Comments: SUBJECTIVE:  Tiffany Armstrong is a 68 y.o. female who complains of congestion, nasal blockage, productive cough, headache, bilateral sinus pain and bilateral ear fullness for 3 days. She denies a history of chest pain, myalgias, nausea, shortness of breath, sweats and vomiting and denies a history of asthma. Patient denies smoke cigarettes.   Patient is a 68 y.o. female presenting with URI. The history is provided by the patient and a relative. A language interpreter was used.  URI Presenting symptoms: congestion, ear pain and rhinorrhea   Presenting symptoms: no fever and no sore throat   Associated symptoms: no headaches and no neck pain     Past Medical History  Diagnosis Date  . Cystocele 04/17/2012  . 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 No pertinent past surgical history. Family History  Problem Relation Age of Onset  . Asthma Mother   . Alcohol abuse Father   . Heart disease Maternal Grandmother    History  Substance Use Topics  . Smoking status: Former Smoker    Types: Cigarettes    Quit date: 07/24/1999  . Smokeless tobacco: Not on file  . Alcohol Use: No   OB History   Grav Para Term Preterm Abortions TAB SAB Ect Mult Living   3 3 3       3      Review of Systems  Constitutional: Positive for activity change. Negative for fever and chills.  HENT: Positive for congestion, ear discharge, ear pain, hearing loss, postnasal drip, rhinorrhea and sinus pressure. Negative for drooling, sore throat and trouble swallowing.   Respiratory: Negative for shortness of breath.   Cardiovascular: Negative for chest pain.  Gastrointestinal: Negative for nausea,  vomiting and abdominal pain.  Genitourinary: Negative for dysuria.  Musculoskeletal: Negative for neck pain.  Neurological: Negative for headaches.      Allergies  Review of patient's allergies indicates no known allergies.  Home Medications   Current Outpatient Rx  Name  Route  Sig  Dispense  Refill  . Chlorphen-Pseudoephed-APAP (THERAFLU FLU/COLD PO)   Oral   Take 1 Package by mouth once.         Marland Kitchen. ibuprofen (ADVIL,MOTRIN) 200 MG tablet   Oral   Take 400 mg by mouth every 6 (six) hours as needed for mild pain.         Marland Kitchen. amoxicillin (AMOXIL) 500 MG capsule   Oral   Take 1 capsule (500 mg total) by mouth 3 (three) times daily.   21 capsule   0   . antipyrine-benzocaine (AURALGAN) otic solution   Both Ears   Place 3-4 drops into both ears every 2 (two) hours as needed for ear pain.   10 mL   0   . ibuprofen (ADVIL,MOTRIN) 400 MG tablet   Oral   Take 1 tablet (400 mg total) by mouth every 6 (six) hours as needed.   30 tablet   0   . oxymetazoline (AFRIN NASAL SPRAY) 0.05 % nasal spray   Each Nare   Place 1 spray into both nostrils 2 (two) times daily.   30 mL   0    BP 107/56  Pulse 72  Temp(Src)  99.8 F (37.7 C) (Oral)  Resp 15  Ht 5\' 4"  (1.626 m)  Wt 122 lb (55.339 kg)  BMI 20.93 kg/m2  SpO2 95% Physical Exam  Nursing note and vitals reviewed. Constitutional: She is oriented to person, place, and time. She appears well-developed and well-nourished.  HENT:  Head: Normocephalic and atraumatic.  Bilateral ear fullness, with no drainage  Eyes: EOM are normal. Pupils are equal, round, and reactive to light.  Neck: Neck supple.  Cardiovascular: Normal rate, regular rhythm and normal heart sounds.   No murmur heard. Pulmonary/Chest: Effort normal. No respiratory distress.  Abdominal: Soft. She exhibits no distension. There is no tenderness. There is no rebound and no guarding.  Neurological: She is alert and oriented to person, place, and time.   Skin: Skin is warm and dry.    ED Course  Procedures (including critical care time) Labs Review Labs Reviewed  COMPREHENSIVE METABOLIC PANEL - Abnormal; Notable for the following:    Glucose, Bld 110 (*)    Albumin 3.3 (*)    All other components within normal limits  CBC WITH DIFFERENTIAL - Abnormal; Notable for the following:    WBC 11.1 (*)    Neutro Abs 8.1 (*)    Monocytes Absolute 1.1 (*)    All other components within normal limits   Imaging Review Dg Chest 2 View  03/17/2014   CLINICAL DATA:  Cough and shortness of breath.  EXAM: CHEST  2 VIEW  COMPARISON:  None.  FINDINGS: Lungs are adequately inflated and otherwise clear. Cardiomediastinal silhouette is within normal. Mild spondylosis of the spine  IMPRESSION: No active cardiopulmonary disease.   Electronically Signed   By: Elberta Fortis M.D.   On: 03/17/2014 22:27     EKG Interpretation None      MDM   Final diagnoses:  Sinusitis  Ear pain, referred  Ear drainage  URI, acute    Pt comes in with cc of URI like complains. Earaches, sinus headaches, nasal congestion. Will treat symptomatically. No PCP, no insurance, so will give her amoxi as well - due to worsening sx. With green phlegm. She is immunocompetent and has 0 SIRS criteria on arrival and no red flags for deep infection.  Derwood Kaplan, MD 03/18/14 [phone]

## 2014-10-11 ENCOUNTER — Encounter (HOSPITAL_COMMUNITY): Payer: Self-pay | Admitting: Emergency Medicine

## 2015-07-21 ENCOUNTER — Ambulatory Visit: Payer: Self-pay | Attending: Internal Medicine

## 2016-07-29 ENCOUNTER — Encounter (HOSPITAL_COMMUNITY): Payer: Self-pay | Admitting: *Deleted

## 2016-07-29 ENCOUNTER — Ambulatory Visit (HOSPITAL_COMMUNITY)
Admission: EM | Admit: 2016-07-29 | Discharge: 2016-07-29 | Disposition: A | Discharge status: Home / Self Care | Payer: Self-pay | Attending: Family Medicine | Admitting: Family Medicine

## 2016-07-29 DIAGNOSIS — L03032 Cellulitis of left toe: Secondary | ICD-10-CM

## 2016-07-29 DIAGNOSIS — B351 Tinea unguium: Secondary | ICD-10-CM

## 2016-07-29 MED ORDER — HYDROCODONE-ACETAMINOPHEN 5-325 MG PO TABS: 1.0000 | ORAL_TABLET | Freq: Four times a day (QID) | ORAL | 0 refills | Status: DC | PRN

## 2016-07-29 MED ORDER — AMOXICILLIN-POT CLAVULANATE 875-125 MG PO TABS: 1.0000 | ORAL_TABLET | Freq: Two times a day (BID) | ORAL | 0 refills | Status: DC

## 2016-07-29 MED ORDER — TERBINAFINE HCL 250 MG PO TABS: 250.0000 mg | ORAL_TABLET | Freq: Every day | ORAL | 2 refills | Status: DC

## 2016-07-29 NOTE — ED Provider Notes (Signed)
[facility]    CSN: [id number] Arrival date & time: 07/29/16  1810  First Provider Contact:  First MD Initiated Contact with Patient 07/29/16 1845        History   Chief Complaint Chief Complaint  Patient presents with  . Toe Pain    HPI Tiffany Armstrong is a 70 y.o. female.   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 She comes in with a week of progressive left great toe pain. She's had no injury to the toe.  She's known she's had problems with ingrown nails and cut them too short in the corners recently. She also no she has toenail fungus in most of her nails.  She's been trying Epsom salt soaks with minimal benefit. The toe is doing quite painful. She has not had to be back at work until Tuesday.      Past Medical History:  Diagnosis Date  . 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 Cystocele 04/17/2012    Patient Active Problem List   Diagnosis Date Noted  . Cystocele 04/17/2012  . Rectocele 04/17/2012  . 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 No pertinent surgical history.  OB History    Gravida Para Term Preterm AB Living   3 3 3     3    SAB TAB Ectopic Multiple Live Births                   Home Medications    Prior to Admission medications   Medication Sig Start Date End Date Taking? Authorizing Provider  amoxicillin (AMOXIL) 500 MG capsule Take 1 capsule (500 mg total) by mouth 3 (three) times daily. 03/18/14   Derwood KaplanAnkit Nanavati, MD  antipyrine-benzocaine Lyla Son(AURALGAN) otic solution Place 3-4 drops into both ears every 2 (two) hours as needed for ear pain. 03/18/14   Derwood KaplanAnkit Nanavati, MD  Chlorphen-Pseudoephed-APAP (THERAFLU FLU/COLD PO) Take 1 Package by mouth once.    Historical Provider, MD  ibuprofen (ADVIL,MOTRIN) 200 MG tablet Take 400 mg by mouth every 6 (six) hours as needed for mild pain.    Historical Provider, MD  ibuprofen (ADVIL,MOTRIN) 400 MG tablet Take 1 tablet (400 mg  total) by mouth every 6 (six) hours as needed. 03/18/14   Derwood KaplanAnkit Nanavati, MD  oxymetazoline (AFRIN NASAL SPRAY) 0.05 % nasal spray Place 1 spray into both nostrils 2 (two) times daily. 03/18/14   Derwood KaplanAnkit Nanavati, MD    Family History Family History  Problem Relation Age of Onset  . Asthma Mother   . Alcohol abuse Father   . Heart disease Maternal Grandmother     Social History Social History  Substance Use Topics  . Smoking status: Former Smoker    Types: Cigarettes    Quit date: 07/24/1999  . Smokeless tobacco: Not on file  . Alcohol use No     Allergies   Review of patient's allergies indicates no known allergies.   Review of Systems Review of Systems  Constitutional: Negative.   HENT: Negative.   Respiratory: Negative.   Gastrointestinal: Negative.   Musculoskeletal: Negative.      Physical Exam Triage Vital Signs ED Triage Vitals  Enc Vitals Group     BP 07/29/16 1826 132/75     Pulse Rate 07/29/16 1826 63     Resp 07/29/16 1826 18     Temp 07/29/16 1826 97.9 F (36.6 C)     Temp Source 07/29/16  1826 Oral     SpO2 07/29/16 1826 97 %     Weight --      Height --      Head Circumference --      Peak Flow --      Pain Score 07/29/16 1842 9     Pain Loc --      Pain Edu? --      Excl. in GC? --    No data found.   Updated Vital Signs BP 132/75 (BP Location: Right Arm)   Pulse 63   Temp 97.9 F (36.6 C) (Oral)   Resp 18   SpO2 97%   Visual Acuity Right Eye Distance:   Left Eye Distance:   Bilateral Distance:    Right Eye Near:   Left Eye Near:    Bilateral Near:     Physical Exam  Constitutional: She appears well-developed and well-nourished.  Skin: Skin is warm and dry. There is erythema.  Left great toe is red and mildly swollen with thickened, onychomycotic nail.  Nursing note and vitals reviewed.    UC Treatments / Results  Labs (all labs ordered are listed, but only abnormal results are displayed) Labs Reviewed - No data to  display  EKG  EKG Interpretation None       Radiology No results found.  Procedures Procedures (including critical care time)  Medications Ordered in UC Medications - No data to display   Initial Impression / Assessment and Plan / UC Course  I have reviewed the triage vital signs and the nursing notes.  Pertinent labs & imaging results that were available during my care of the patient were reviewed by me and considered in my medical decision making (see chart for details).  Clinical Course      Final Clinical Impressions(s) / UC Diagnoses   Final diagnoses:  None    New Prescriptions New Prescriptions   No medications on file     Elvina SidleKurt Jenan Ellegood, MD 07/29/16 1902

## 2016-07-29 NOTE — ED Triage Notes (Signed)
Pt  Has  A  Red swollen l  Big  toenal   X  10  Days     witth  Redness  Present  In  The  Affected  Toe       Pt denys  Any  Injury

## 2017-08-06 ENCOUNTER — Other Ambulatory Visit: Payer: Self-pay | Admitting: Orthopaedic Surgery

## 2017-08-06 DIAGNOSIS — M4716 Other spondylosis with myelopathy, lumbar region: Secondary | ICD-10-CM

## 2017-10-18 ENCOUNTER — Encounter (HOSPITAL_COMMUNITY): Payer: Self-pay

## 2017-11-29 ENCOUNTER — Encounter (HOSPITAL_COMMUNITY): Payer: Self-pay

## 2018-05-08 ENCOUNTER — Encounter (HOSPITAL_COMMUNITY): Payer: Self-pay | Admitting: Family Medicine

## 2018-05-08 ENCOUNTER — Ambulatory Visit (HOSPITAL_COMMUNITY)
Admission: EM | Admit: 2018-05-08 | Discharge: 2018-05-08 | Disposition: A | Discharge status: Home / Self Care | Payer: Self-pay | Attending: Internal Medicine | Admitting: Internal Medicine

## 2018-05-08 DIAGNOSIS — R300 Dysuria: Secondary | ICD-10-CM

## 2018-05-08 LAB — POCT I-STAT, CHEM 8
BUN: 9 mg/dL (ref 6–20)
CHLORIDE: 96 mmol/L — AB (ref 101–111)
Calcium, Ion: 1.12 mmol/L — ABNORMAL LOW (ref 1.15–1.40)
Creatinine, Ser: 0.7 mg/dL (ref 0.44–1.00)
Glucose, Bld: 110 mg/dL — ABNORMAL HIGH (ref 65–99)
HCT: 32 % — ABNORMAL LOW (ref 36.0–46.0)
Hemoglobin: 10.9 g/dL — ABNORMAL LOW (ref 12.0–15.0)
Potassium: 4.4 mmol/L (ref 3.5–5.1)
SODIUM: 134 mmol/L — AB (ref 135–145)
TCO2: 27 mmol/L (ref 22–32)

## 2018-05-08 LAB — POCT URINALYSIS DIP (DEVICE)
BILIRUBIN URINE: NEGATIVE
Glucose, UA: NEGATIVE mg/dL
KETONES UR: NEGATIVE mg/dL
Nitrite: NEGATIVE
Protein, ur: NEGATIVE mg/dL
Urobilinogen, UA: 0.2 mg/dL (ref 0.0–1.0)
pH: 5.5 (ref 5.0–8.0)

## 2018-05-08 MED ORDER — AMOXICILLIN-POT CLAVULANATE 875-125 MG PO TABS: 1.0000 | ORAL_TABLET | Freq: Two times a day (BID) | ORAL | 0 refills | Status: DC

## 2018-05-08 NOTE — ED Provider Notes (Signed)
05/08/2018 7:25 PM   DOB: [birth year] / MRN: [id number]  SUBJECTIVE:  Tiffany Armstrong is a 72 y.o. female presenting for dysuria x1 week.  She is tried ibuprofen with some improvement.  She associates fever body aches chills and back pain and hematuria.  She also states she has been using the bathroom more frequently with incomplete bladder emptying.  She has No Known Allergies.   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    She  reports that she quit smoking about 18 years ago. Her smoking use included cigarettes. She does not have any smokeless tobacco history on file. She reports that she does not drink alcohol or use drugs. She  reports that she does not currently engage in sexual activity. She reports using the following method of birth control/protection: None. The patient  has no past surgical history on file.  Her family history includes Alcohol abuse in her father; Asthma in her mother; Heart disease in her maternal grandmother.  ROS per HPI  OBJECTIVE:  BP (!) 116/43   Pulse 92   Temp 99.3 F (37.4 C)   Resp 18   SpO2 97%   Wt Readings from Last 3 Encounters:  03/17/14 122 lb (55.3 kg)  12/22/13 128 lb 6.4 oz (58.2 kg)  07/23/13 132 lb 3.2 oz (60 kg)   Temp Readings from Last 3 Encounters:  05/08/18 99.3 F (37.4 C)  07/29/16 97.9 F (36.6 C) (Oral)  03/18/14 99.8 F (37.7 C) (Oral)   BP Readings from Last 3 Encounters:  05/08/18 (!) 116/43  07/29/16 132/75  03/18/14 107/56   Pulse Readings from Last 3 Encounters:  05/08/18 92  07/29/16 63  03/18/14 72    Physical Exam  Constitutional: She is oriented to person, place, and time. She appears well-developed.  Eyes: Pupils are equal, round, and reactive to light. EOM are normal.  Cardiovascular: Normal rate.  Pulmonary/Chest: Effort normal.  Abdominal: She exhibits no distension.  Musculoskeletal: Normal range of motion.  Neurological: She is  alert and oriented to person, place, and time. No cranial nerve deficit.  Skin: Skin is warm and dry. She is not diaphoretic.  Psychiatric: She has a normal mood and affect.  Vitals reviewed.   Results for orders placed or performed during the hospital encounter of 05/08/18 (from the past 72 hour(s))  POCT urinalysis dip (device)     Status: Abnormal   Collection Time: 05/08/18  6:14 PM  Result Value Ref Range   Glucose, UA NEGATIVE NEGATIVE mg/dL   Bilirubin Urine NEGATIVE NEGATIVE   Ketones, ur NEGATIVE NEGATIVE mg/dL   Specific Gravity, Urine <=1.005 1.005 - 1.030   Hgb urine dipstick TRACE (A) NEGATIVE   pH 5.5 5.0 - 8.0   Protein, ur NEGATIVE NEGATIVE mg/dL   Urobilinogen, UA 0.2 0.0 - 1.0 mg/dL   Nitrite NEGATIVE NEGATIVE   Leukocytes, UA TRACE (A) NEGATIVE    Comment: Biochemical Testing Only. Please order routine urinalysis from main lab if confirmatory testing is needed.  I-STAT, chem 8     Status: Abnormal   Collection Time: 05/08/18  7:16 PM  Result Value Ref Range   Sodium 134 (L) 135 - 145 mmol/L   Potassium 4.4 3.5 - 5.1 mmol/L   Chloride 96 (L) 101 - 111 mmol/L   BUN 9 6 - 20 mg/dL   Creatinine, Ser 7.84 0.44 - 1.00 mg/dL   Glucose, Bld 696 (H) 65 -  99 mg/dL   Calcium, Ion 9.60 (L) 1.15 - 1.40 mmol/L   TCO2 27 22 - 32 mmol/L   Hemoglobin 10.9 (L) 12.0 - 15.0 g/dL   HCT 45.4 (L) 09.8 - 11.9 %    No results found.  ASSESSMENT AND PLAN:   Dysuria mild decrease in hemoglobin hematocrit noted.  I would like her to come back in 3 days for recheck.  I am starting her on Augmentin given complaints of dysuria, urgency, frequency, fever, chills.  Abdominal exam negative.    Discharge Instructions     Start antibiotics.  Come back in three days.  If you are worsening then please go to the ED and that time.        The patient is advised to call or return to clinic if she does not see an improvement in symptoms, or to seek the care of the closest emergency  department if she worsens with the above plan.   Deliah Boston, MHS, PA-C 05/08/2018 7:25 PM   Ofilia Neas, PA-C 05/08/18 [id number]

## 2018-05-08 NOTE — ED Triage Notes (Signed)
Pt here for fever, body aches, chill, back pain, hematuria and dysuria x 1 week. . She has been taking naproxen and ibuprofen.

## 2018-05-08 NOTE — Discharge Instructions (Signed)
Start antibiotics.  Come back in three days.  If you are worsening then please go to the ED and that time.

## 2018-09-02 ENCOUNTER — Encounter (HOSPITAL_COMMUNITY): Payer: Self-pay | Admitting: Emergency Medicine

## 2018-09-02 ENCOUNTER — Ambulatory Visit (HOSPITAL_COMMUNITY)
Admission: EM | Admit: 2018-09-02 | Discharge: 2018-09-02 | Disposition: A | Discharge status: Home / Self Care | Payer: Self-pay | Attending: Family Medicine | Admitting: Family Medicine

## 2018-09-02 ENCOUNTER — Ambulatory Visit (INDEPENDENT_AMBULATORY_CARE_PROVIDER_SITE_OTHER): Payer: Self-pay

## 2018-09-02 DIAGNOSIS — M79672 Pain in left foot: Secondary | ICD-10-CM

## 2018-09-02 MED ORDER — NAPROXEN 375 MG PO TABS: 375.0000 mg | ORAL_TABLET | Freq: Two times a day (BID) | ORAL | 0 refills | Status: DC

## 2018-09-02 NOTE — Discharge Instructions (Addendum)
Wear your walking boot for the next week when standing or walking. If you are not seeing improvement you may need to follow up with an orthopaedist.

## 2018-09-02 NOTE — ED Triage Notes (Signed)
Pt here with left foot pain x several months worse x 1 week in arch of foot

## 2018-09-02 NOTE — ED Provider Notes (Signed)
[facility]   [id number] 09/02/18 Arrival Time: 1030  ASSESSMENT & PLAN:  1. Foot pain, left    Possible tendonitis.  Imaging: Dg Foot Complete Left  Result Date: 09/02/2018 CLINICAL DATA:  Pain with weight-bearing near the first metatarsal head EXAM: LEFT FOOT - COMPLETE 3+ VIEW COMPARISON:  None. FINDINGS: Tarsal-metatarsal alignment is normal. Joint spaces are relatively normal with exception of DIP joints with degenerative change is evident. No erosion is seen. No fracture is noted. IMPRESSION: Degenerative change involves the DIP joints. No acute fracture is seen. Electronically Signed   By: Dwyane Dee M.D.   On: 09/02/2018 11:35   Meds ordered this encounter  Medications  . naproxen (NAPROSYN) 375 MG tablet    Sig: Take 1 tablet (375 mg total) by mouth 2 (two) times daily.    Dispense:  20 tablet    Refill:  0   CAM walker for comfort.  Follow-up Information    [facility].   Specialty:  Urgent Care Why:  As needed. Contact information: [address] [place] [address] [phone]           Discharge Instructions     Wear your walking boot for the next week when standing or walking. If you are not seeing improvement you may need to follow up with an orthopaedist.     Reviewed expectations re: course of current medical issues. Questions answered. Outlined signs and symptoms indicating need for more acute intervention. Patient verbalized understanding. After Visit Summary given.  SUBJECTIVE: History from: patient. Tiffany Armstrong is a 72 y.o. female who reports fairly persistent moderate pain of her left medial foot that has gradually worsened since beginning; described as aching without radiation. Onset: gradual, over the past several days. Injury/trama: no. Relieved by: rest. Worsened by: movements and weight bearing. Associated symptoms: none reported. Extremity sensation changes or  weakness: none. Self treatment: prn Naprosyn with mild help. History of similar: yes, but not as painful  ROS: As per HPI.   OBJECTIVE:  Vitals:   09/02/18 1106  BP: (!) 148/58  Pulse: 81  Resp: 16  Temp: 99.1 F (37.3 C)  TempSrc: Oral  SpO2: 98%    General appearance: alert; no distress Extremities: warm and well perfused; symmetrical with no gross deformities; poorly localized tenderness over her left medial foot; some plantar tenderness; with no swelling and no bruising; ROM around area or areas of discomfort: normal CV: brisk extremity capillary refill Skin: warm and dry Neurologic: normal gait; normal symmetric reflexes in all extremities; normal sensation in all extremities Psychological: alert and cooperative; normal mood and affect  No Known Allergies  Past Medical History:  Diagnosis Date  . 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 Cystocele 04/17/2012   Social History   Socioeconomic History  . Marital status: Single    Spouse name: Not on file  . Number of children: Not on file  . Years of education: Not on file  . Highest education level: Not on file  Occupational History  . Not on file  Social Needs  . Financial resource strain: Not on file  . Food insecurity:    Worry: Not on file    Inability: Not on file  . Transportation needs:    Medical: Not on file    Non-medical: Not on file  Tobacco Use  . Smoking status: Former Smoker    Types: Cigarettes  Last attempt to quit: 07/24/1999    Years since quitting: 19.1  Substance and Sexual Activity  . Alcohol use: No  . Drug use: No  . Sexual activity: Not Currently    Birth control/protection: None  Lifestyle  . Physical activity:    Days per week: Not on file    Minutes per session: Not on file  . Stress: Not on file  Relationships  . Social connections:    Talks on phone: Not on file    Gets together: Not on file    Attends religious service: Not on file    Active  member of club or organization: Not on file    Attends meetings of clubs or organizations: Not on file    Relationship status: Not on file  Other Topics Concern  . Not on file  Social History Narrative  . Not on file   Family History  Problem Relation Age of Onset  . Asthma Mother   . Alcohol abuse Father   . Heart disease Maternal Grandmother    History reviewed. No pertinent surgical history.    Mardella LaymanHagler, Minh Jasper, MD 09/02/18 [phone]

## 2020-06-09 ENCOUNTER — Ambulatory Visit: Payer: Self-pay | Admitting: Podiatry

## 2021-12-19 ENCOUNTER — Encounter (HOSPITAL_COMMUNITY): Payer: Self-pay

## 2021-12-19 ENCOUNTER — Emergency Department (HOSPITAL_COMMUNITY): Payer: Self-pay

## 2021-12-19 ENCOUNTER — Emergency Department (HOSPITAL_COMMUNITY)
Admission: EM | Admit: 2021-12-19 | Discharge: 2021-12-20 | Disposition: A | Discharge status: Home / Self Care | Payer: Self-pay | Attending: Emergency Medicine | Admitting: Emergency Medicine

## 2021-12-19 DIAGNOSIS — R1013 Epigastric pain: Secondary | ICD-10-CM | POA: Insufficient documentation

## 2021-12-19 DIAGNOSIS — R042 Hemoptysis: Secondary | ICD-10-CM | POA: Insufficient documentation

## 2021-12-19 DIAGNOSIS — U071 COVID-19: Secondary | ICD-10-CM | POA: Insufficient documentation

## 2021-12-19 LAB — CBC WITH DIFFERENTIAL/PLATELET
Abs Immature Granulocytes: 0.06 10*3/uL (ref 0.00–0.07)
Basophils Absolute: 0.1 10*3/uL (ref 0.0–0.1)
Basophils Relative: 1 %
Eosinophils Absolute: 0.3 10*3/uL (ref 0.0–0.5)
Eosinophils Relative: 4 %
HCT: 37.7 % (ref 36.0–46.0)
Hemoglobin: 12.5 g/dL (ref 12.0–15.0)
Immature Granulocytes: 1 %
Lymphocytes Relative: 26 %
Lymphs Abs: 2.4 10*3/uL (ref 0.7–4.0)
MCH: 32 pg (ref 26.0–34.0)
MCHC: 33.2 g/dL (ref 30.0–36.0)
MCV: 96.4 fL (ref 80.0–100.0)
Monocytes Absolute: 0.7 10*3/uL (ref 0.1–1.0)
Monocytes Relative: 7 %
Neutro Abs: 5.8 10*3/uL (ref 1.7–7.7)
Neutrophils Relative %: 61 %
Platelets: 317 10*3/uL (ref 150–400)
RBC: 3.91 MIL/uL (ref 3.87–5.11)
RDW: 13.4 % (ref 11.5–15.5)
WBC: 9.3 10*3/uL (ref 4.0–10.5)
nRBC: 0 % (ref 0.0–0.2)

## 2021-12-19 LAB — RESP PANEL BY RT-PCR (FLU A&B, COVID) ARPGX2
Influenza A by PCR: NEGATIVE
Influenza B by PCR: NEGATIVE
SARS Coronavirus 2 by RT PCR: POSITIVE — AB

## 2021-12-19 MED ORDER — MORPHINE SULFATE (PF) 2 MG/ML IV SOLN
2.0000 mg | Freq: Once | INTRAVENOUS | Status: AC
  Administered 2021-12-19: 2 mg via INTRAVENOUS
  Filled 2021-12-19: qty 1

## 2021-12-19 NOTE — ED Provider Triage Note (Signed)
Emergency Medicine Provider Triage Evaluation Note  Tiffany Armstrong , a 76 y.o. female  was evaluated in triage.  Pt complains of cough.  Patient states that she was diagnosed with COVID-19 4 days ago.  Her daughter denies any history of COVID-19 infections and states she is not vaccinated for COVID-19.  She has been experiencing a productive cough.  2 days ago she began noting intermittent hemoptysis with her cough and has multiple tissues with her that are soaked in bloody sputum.  Denies any chest pain or shortness of breath.  She then began developing abdominal pain states it is mostly upper and right-sided.  No vomiting or diarrhea.  Physical Exam  BP (!) 176/84 (BP Location: Right Arm)    Pulse 87    Temp 98.6 F (37 C) (Oral)    Resp (!) 22    Ht 5\' 4"  (1.626 m)    Wt 55 kg    SpO2 95%    BMI 20.81 kg/m  Gen:   Awake, no distress   Resp:  Normal effort  MSK:   Moves extremities without difficulty  Other:  RRR wtihout M/R/G. LCTAB.  Abdomen is soft.  Mild to moderate tenderness noted along the epigastrium and right side of the abdomen diffusely.  Medical Decision Making  Medically screening exam initiated at 10:33 PM.  Appropriate orders placed.  Tiffany Armstrong was informed that the remainder of the evaluation will be completed by another provider, this initial triage assessment does not replace that evaluation, and the importance of remaining in the ED until their evaluation is complete.  Will obtain screening labs.  CT of the chest.  CT of the abdomen/pelvis.   Huntley Estelle, PA-C 12/19/21 2236

## 2021-12-19 NOTE — ED Triage Notes (Signed)
Pt arrives POV for eval of RUQ/RLQ abd pain onset about 3 days PTA. Pt denies N/V. Pt was also dx'd w/ covid last week via home test. Pt reports she is coughing, w/ hemoptysis. Brought small clots w/ her to ED and reports she has been coughing these up. Denies fever.

## 2021-12-20 ENCOUNTER — Emergency Department (HOSPITAL_COMMUNITY): Payer: Self-pay

## 2021-12-20 LAB — COMPREHENSIVE METABOLIC PANEL
ALT: 51 U/L — ABNORMAL HIGH (ref 0–44)
AST: 45 U/L — ABNORMAL HIGH (ref 15–41)
Albumin: 3.2 g/dL — ABNORMAL LOW (ref 3.5–5.0)
Alkaline Phosphatase: 89 U/L (ref 38–126)
Anion gap: 8 (ref 5–15)
BUN: 10 mg/dL (ref 8–23)
CO2: 28 mmol/L (ref 22–32)
Calcium: 8.5 mg/dL — ABNORMAL LOW (ref 8.9–10.3)
Chloride: 104 mmol/L (ref 98–111)
Creatinine, Ser: 0.79 mg/dL (ref 0.44–1.00)
GFR, Estimated: >60 mL/min (ref >60)
Glucose, Bld: 116 mg/dL — ABNORMAL HIGH (ref 70–99)
Potassium: 3.9 mmol/L (ref 3.5–5.1)
Sodium: 140 mmol/L (ref 135–145)
Total Bilirubin: 0.6 mg/dL (ref 0.3–1.2)
Total Protein: 7.5 g/dL (ref 6.5–8.1)

## 2021-12-20 LAB — LIPASE, BLOOD: Lipase: 34 U/L (ref 11–51)

## 2021-12-20 MED ORDER — BENZONATATE 100 MG PO CAPS: 100.0000 mg | ORAL_CAPSULE | Freq: Three times a day (TID) | ORAL | 0 refills | Status: DC

## 2021-12-20 MED ORDER — IOHEXOL 350 MG/ML SOLN
75.0000 mL | Freq: Once | INTRAVENOUS | Status: AC | PRN
  Administered 2021-12-20: 75 mL via INTRAVENOUS

## 2021-12-20 MED ORDER — HYDROCODONE BIT-HOMATROP MBR 5-1.5 MG/5ML PO SOLN: 5.0000 mL | Freq: Four times a day (QID) | ORAL | 0 refills | Status: DC | PRN

## 2021-12-20 MED ORDER — HYDROCODONE-ACETAMINOPHEN 7.5-325 MG/15ML PO SOLN
10.0000 mL | Freq: Once | ORAL | Status: DC
  Filled 2021-12-20: qty 15

## 2021-12-20 MED ORDER — HYDROCODONE-ACETAMINOPHEN 5-325 MG PO TABS
1.0000 | ORAL_TABLET | Freq: Once | ORAL | Status: AC
  Administered 2021-12-20: 1 via ORAL
  Filled 2021-12-20: qty 1

## 2021-12-20 NOTE — ED Provider Notes (Signed)
[facility] EMERGENCY DEPARTMENT Provider Note   CSN: [id number] Arrival date & time: 12/19/21  2224     History  Chief Complaint  Patient presents with   Cough   Abdominal Pain    Tiffany Armstrong is a 76 y.o. female.  The history is provided by the patient and a relative. The history is limited by a language barrier. A language interpreter was used.  Cough Abdominal Pain Associated symptoms: cough    76 year old Hispanic female presenting to ED follow-up evaluation of abdominal pain and recent positive COVID test.  History obtained through her son who serves as the language interpreter.  5 days ago patient developed sinus congestion, persistent cough, body aches, decrease in appetite, followed by abdominal pain.  After several days of coughing she is now coughing up specks of blood in her sputum.  She did a home COVID test which came back positive.  She has been taking over-the-counter medication at home without adequate relief.  She also complains of pain to her right upper abdomen that is recurrent and worse with coughing.  No report of fever, shortness of breath, nausea vomiting diarrhea or dysuria.  Home Medications Prior to Admission medications   Medication Sig Start Date End Date Taking? Authorizing Provider  amoxicillin-clavulanate (AUGMENTIN) 875-125 MG tablet Take 1 tablet by mouth every 12 (twelve) hours. Patient not taking: Reported on 09/02/2018 05/08/18   Tereasa Coop, PA-C  naproxen (NAPROSYN) 375 MG tablet Take 1 tablet (375 mg total) by mouth 2 (two) times daily. 09/02/18   Vanessa Kick, MD      Allergies    Patient has no known allergies.    Review of Systems   Review of Systems  Respiratory:  Positive for cough.   Gastrointestinal:  Positive for abdominal pain.  All other systems reviewed and are negative.  Physical Exam Updated Vital Signs BP 140/69 (BP Location: Right Arm)    Pulse 74    Temp 99 F (37.2 C) (Oral)    Resp 20    Ht  5\' 4"  (1.626 m)    Wt 55 kg    SpO2 95%    BMI 20.81 kg/m  Physical Exam Vitals and nursing note reviewed.  Constitutional:      General: She is not in acute distress.    Appearance: She is well-developed.  HENT:     Head: Atraumatic.  Eyes:     Conjunctiva/sclera: Conjunctivae normal.  Cardiovascular:     Rate and Rhythm: Normal rate and regular rhythm.     Heart sounds: Normal heart sounds.  Pulmonary:     Effort: Pulmonary effort is normal.     Breath sounds: No wheezing, rhonchi or rales.  Abdominal:     Tenderness: There is abdominal tenderness (Mild tenderness to epigastric and right upper quadrant without guarding or rebound tenderness) in the right upper quadrant.  Musculoskeletal:     Cervical back: Neck supple.  Skin:    Findings: No rash.  Neurological:     Mental Status: She is alert.  Psychiatric:        Mood and Affect: Mood normal.    ED Results / Procedures / Treatments   Labs (all labs ordered are listed, but only abnormal results are displayed) Labs Reviewed  RESP PANEL BY RT-PCR (FLU A&B, COVID) ARPGX2 - Abnormal; Notable for the following components:      Result Value   SARS Coronavirus 2 by RT PCR POSITIVE (*)    All other  components within normal limits  COMPREHENSIVE METABOLIC PANEL - Abnormal; Notable for the following components:   Glucose, Bld 116 (*)    Calcium 8.5 (*)    Albumin 3.2 (*)    AST 45 (*)    ALT 51 (*)    All other components within normal limits  CBC WITH DIFFERENTIAL/PLATELET  LIPASE, BLOOD    EKG None  Radiology DG Chest 2 View  Result Date: 12/19/2021 CLINICAL DATA:  Mild cyst. EXAM: CHEST - 2 VIEW COMPARISON:  Chest radiograph dated 03/17/2014. FINDINGS: No focal consolidation, pleural effusion, pneumothorax. The cardiac silhouette is within normal limits. No acute osseous pathology. IMPRESSION: No active cardiopulmonary disease. Electronically Signed   By: Anner Crete M.D.   On: 12/19/2021 23:00   CT Angio  Chest PE W and/or Wo Contrast  Result Date: 12/20/2021 CLINICAL DATA:  Hemoptysis, COVID.  Right-sided abdominal pain. EXAM: CT ANGIOGRAPHY CHEST CT ABDOMEN AND PELVIS WITH CONTRAST TECHNIQUE: Multidetector CT imaging of the chest was performed using the standard protocol during bolus administration of intravenous contrast. Multiplanar CT image reconstructions and MIPs were obtained to evaluate the vascular anatomy. Multidetector CT imaging of the abdomen and pelvis was performed using the standard protocol during bolus administration of intravenous contrast. RADIATION DOSE REDUCTION: This exam was performed according to the departmental dose-optimization program which includes automated exposure control, adjustment of the mA and/or kV according to patient size and/or use of iterative reconstruction technique. CONTRAST:  54mL OMNIPAQUE IOHEXOL 350 MG/ML SOLN COMPARISON:  None. FINDINGS: CTA CHEST FINDINGS Cardiovascular: Satisfactory opacification of the pulmonary arteries to the segmental level. No evidence of pulmonary embolism. Normal heart size. No pericardial effusion. Mediastinum/Nodes: No enlarged mediastinal, hilar, or axillary lymph nodes. Thyroid gland, trachea, and esophagus demonstrate no significant findings. Lungs/Pleura: There are minimal atelectatic changes in the lung bases. The lungs are otherwise clear. There is no pleural effusion or pneumothorax. Trachea and central airways are patent. Musculoskeletal: No chest wall abnormality. No acute or significant osseous findings. Review of the MIP images confirms the above findings. CT ABDOMEN and PELVIS FINDINGS Hepatobiliary: There are rounded hypodensities in the liver which are too small to characterize, possibly small cysts or hemangiomas. There is a 14 mm cyst in the left lobe. No biliary ductal dilatation. Gallbladder is not visualized. Pancreas: Unremarkable. No pancreatic ductal dilatation or surrounding inflammatory changes. Spleen: Normal in  size without focal abnormality. Adrenals/Urinary Tract: The bladder is moderately distended. There is a small amount of air in the bladder. Kidneys and adrenal glands are within normal limits. Stomach/Bowel: There is moderate distention of the stomach. There is also fluid distention of the duodenum which is mildly dilated measuring up to 3.5 cm. Otherwise, small bowel loops are nondilated, but are predominantly fluid-filled. No focal inflammation. Colon is within normal limits. Appendix is not seen. Vascular/Lymphatic: Aortic atherosclerosis. No enlarged abdominal or pelvic lymph nodes. Reproductive: Uterus and bilateral adnexa are unremarkable. Other: No abdominal wall hernia or abnormality. No abdominopelvic ascites. Musculoskeletal: Degenerative changes affect the spine. Review of the MIP images confirms the above findings. IMPRESSION: 1. No evidence for pulmonary embolism. 2. No acute cardiopulmonary process. 3. There is fluid distention of the stomach, duodenum and small bowel which can be seen with gastroenteritis. 4. Small amount of air in the bladder. Correlate clinically for infection. 5.  Aortic Atherosclerosis (ICD10-I70.0). Electronically Signed   By: Ronney Asters M.D.   On: 12/20/2021 01:06   CT ABDOMEN PELVIS W CONTRAST  Result Date: 12/20/2021 CLINICAL DATA:  Hemoptysis, COVID.  Right-sided abdominal pain. EXAM: CT ANGIOGRAPHY CHEST CT ABDOMEN AND PELVIS WITH CONTRAST TECHNIQUE: Multidetector CT imaging of the chest was performed using the standard protocol during bolus administration of intravenous contrast. Multiplanar CT image reconstructions and MIPs were obtained to evaluate the vascular anatomy. Multidetector CT imaging of the abdomen and pelvis was performed using the standard protocol during bolus administration of intravenous contrast. RADIATION DOSE REDUCTION: This exam was performed according to the departmental dose-optimization program which includes automated exposure control,  adjustment of the mA and/or kV according to patient size and/or use of iterative reconstruction technique. CONTRAST:  24mL OMNIPAQUE IOHEXOL 350 MG/ML SOLN COMPARISON:  None. FINDINGS: CTA CHEST FINDINGS Cardiovascular: Satisfactory opacification of the pulmonary arteries to the segmental level. No evidence of pulmonary embolism. Normal heart size. No pericardial effusion. Mediastinum/Nodes: No enlarged mediastinal, hilar, or axillary lymph nodes. Thyroid gland, trachea, and esophagus demonstrate no significant findings. Lungs/Pleura: There are minimal atelectatic changes in the lung bases. The lungs are otherwise clear. There is no pleural effusion or pneumothorax. Trachea and central airways are patent. Musculoskeletal: No chest wall abnormality. No acute or significant osseous findings. Review of the MIP images confirms the above findings. CT ABDOMEN and PELVIS FINDINGS Hepatobiliary: There are rounded hypodensities in the liver which are too small to characterize, possibly small cysts or hemangiomas. There is a 14 mm cyst in the left lobe. No biliary ductal dilatation. Gallbladder is not visualized. Pancreas: Unremarkable. No pancreatic ductal dilatation or surrounding inflammatory changes. Spleen: Normal in size without focal abnormality. Adrenals/Urinary Tract: The bladder is moderately distended. There is a small amount of air in the bladder. Kidneys and adrenal glands are within normal limits. Stomach/Bowel: There is moderate distention of the stomach. There is also fluid distention of the duodenum which is mildly dilated measuring up to 3.5 cm. Otherwise, small bowel loops are nondilated, but are predominantly fluid-filled. No focal inflammation. Colon is within normal limits. Appendix is not seen. Vascular/Lymphatic: Aortic atherosclerosis. No enlarged abdominal or pelvic lymph nodes. Reproductive: Uterus and bilateral adnexa are unremarkable. Other: No abdominal wall hernia or abnormality. No  abdominopelvic ascites. Musculoskeletal: Degenerative changes affect the spine. Review of the MIP images confirms the above findings. IMPRESSION: 1. No evidence for pulmonary embolism. 2. No acute cardiopulmonary process. 3. There is fluid distention of the stomach, duodenum and small bowel which can be seen with gastroenteritis. 4. Small amount of air in the bladder. Correlate clinically for infection. 5.  Aortic Atherosclerosis (ICD10-I70.0). Electronically Signed   By: Ronney Asters M.D.   On: 12/20/2021 01:06    Procedures .1-3 Lead EKG Interpretation Performed by: Domenic Moras, PA-C Authorized by: Domenic Moras, PA-C     Interpretation: normal     ECG rate:  83   ECG rate assessment: normal     Rhythm: sinus rhythm     Ectopy: none     Conduction: normal        COVID-19 Vaccine Information can be found at: [email or web address] For questions related to vaccine distribution or appointments, please email [email or web address] or call [phone].   Medications Ordered in ED Medications  HYDROcodone-acetaminophen (HYCET) 7.5-325 mg/15 ml solution 10 mL (has no administration in time range)  morphine 2 MG/ML injection 2 mg (2 mg Intravenous Given 12/19/21 2243)  iohexol (OMNIPAQUE) 350 MG/ML injection 75 mL (75 mLs Intravenous Contrast Given 12/20/21 0049)    ED Course/ Medical Decision Making/ A&P  Medical Decision Making  BP (!) 141/58    Pulse 69    Temp 99 F (37.2 C) (Oral)    Resp 20    Ht 5\' 4"  (1.626 m)    Wt 55 kg    SpO2 96%    BMI 20.81 kg/m   7:16 AM This is a 76 year old Hispanic female without significant past medical history presenting with symptoms consistent with COVID.  She has had flulike symptoms ongoing for the past 5 days, has had a positive COVID test at home.  She is here due to persistent cough, seeing specks of blood in her cough as well as endorsing pain to her right upper  quadrant.  She is overall well-appearing, she is not hypoxic, vital signs are reassuring.  A thorough work-up was performed today including labs, chest CTA as well as CT of the abdomen pelvis.  All labs and imaging were independently reviewed interpreted by me.  Patient's COVID test did come back positive.  She has minimal mild transaminitis with AST 45, ALT 51.  WBC and H&H are within normal limit, normal lipase.  CT scan of the chest abdomen pelvis show no evidence of PE.  No acute cardiopulmonary process was noted.  There is some fluid distention of the stomach, duodenum, and small bowel which can be seen in gastroenteritis.  There are small amount of air in the bladder.  Patient however is without any nausea vomiting diarrhea to suggest gastroenteritis.  She does not complain of any urinary symptoms either.  Although the appendix is not seen on CT scan she does not have any significant right lower quadrant abdominal pain to suggest ongoing appendicitis.  Her gallbladder is not visualized on CT scan but there is no biliary ductal dilatation.  Her symptom does not suggest gallbladder etiology.  In the setting of reassuring labs, no hypoxia, and patient is overall well-appearing, she is outside the window for antiviral medication such as Paxlovid.  Suspect hemoptysis is likely due to local irritation of her trachea from persistent coughing.  Will provide symptomatic treatment and patient likely stable for discharge.  I have discussed care thoroughly with patient and with her son and all questions answered.  I have discussed care of plan with Dr. Tomi Bamberger who has evaluated pt and and agrees with plan.   Yesenia Richarson was evaluated in Emergency Department on 12/20/2021 for the symptoms described in the history of present illness. She was evaluated in the context of the global COVID-19 pandemic, which necessitated consideration that the patient might be at risk for infection with the SARS-CoV-2 virus that causes  COVID-19. Institutional protocols and algorithms that pertain to the evaluation of patients at risk for COVID-19 are in a state of rapid change based on information released by regulatory bodies including the CDC and federal and state organizations. These policies and algorithms were followed during the patient's care in the ED.         Final Clinical Impression(s) / ED Diagnoses Final diagnoses:  [id number] virus infection    Rx / DC Orders ED Discharge Orders          Ordered    HYDROcodone bit-homatropine (HYCODAN) 5-1.5 MG/5ML syrup  Every 6 hours PRN        12/20/21 0748    benzonatate (TESSALON) 100 MG capsule  Every 8 hours        12/20/21 0748              Domenic Moras, PA-C 12/24/21 1546  Dorie Rank, MD 12/25/21 1504

## 2021-12-20 NOTE — ED Notes (Signed)
Requested med for pain. MD notified.

## 2021-12-20 NOTE — Discharge Instructions (Addendum)
You have been evaluated for your covid infection.  Your symptoms are related to the infection.  Fortunately no other concerning finding were noted on today's exam.  Please take medications prescribed as needed to control your symptoms.  Follow instruction below.  Recommendations for at home COVID-19 symptoms management:   If have acute worsening of symptoms please go to ER/urgent care for further evaluation. Check pulse oximetry and if below 90-92% please go to ER. The following supplements MAY help:  Vitamin C 500mg  twice a day and Quercetin 250-500 mg twice a day Vitamin D3 2000 - 4000 u/day B Complex vitamins Zinc 75-100 mg/day Melatonin 6-10 mg at night (the optimal dose is unknown) Aspirin 81mg /day (if no history of bleeding issues)

## 2021-12-20 NOTE — ED Notes (Signed)
AVS with prescriptions provided to and discussed with patient and family member at bedside. Pt verbalizes understanding of discharge instructions and denies any questions or concerns at this time. Pt has ride home. Pt taken out of department via W/C.  ? ?

## 2021-12-20 NOTE — ED Notes (Signed)
Edp at bedside °

## 2023-01-20 IMAGING — CT CT ABD-PELV W/ CM
2 of 5 series · 14 of 46 positions shown, 16 images · IV contrast (agent unspecified)
Comparison: None.

CLINICAL DATA: Hemoptysis, COVID.  Right-sided abdominal pain.



[Series 3: a/p w/ 5mm · axial · 0.98mm/px · z∈[+814,+1234]mm · 11 of 94 slices shown, 13 images]
[im 5/94  soft-tissue]
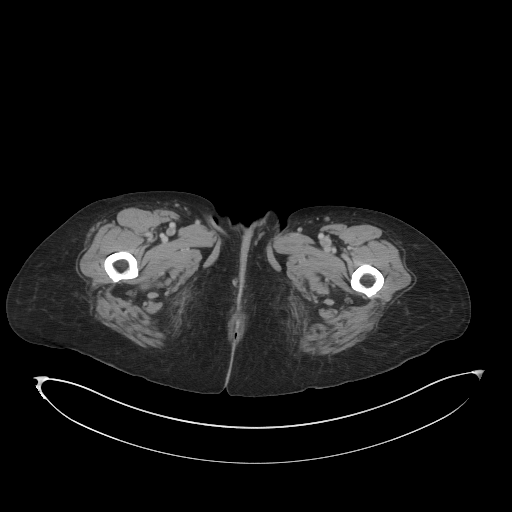
[im 5/94  bone]
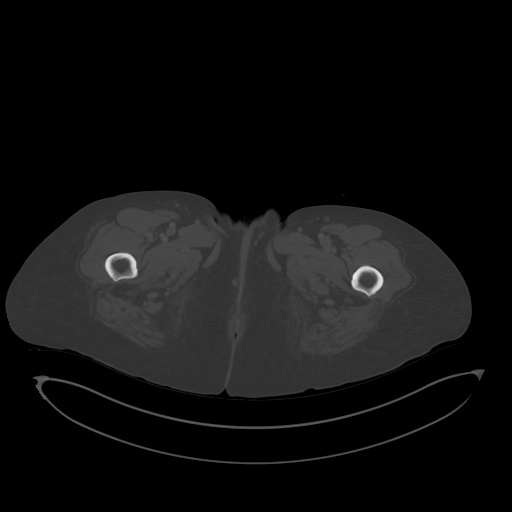
[im 15/94  soft-tissue]
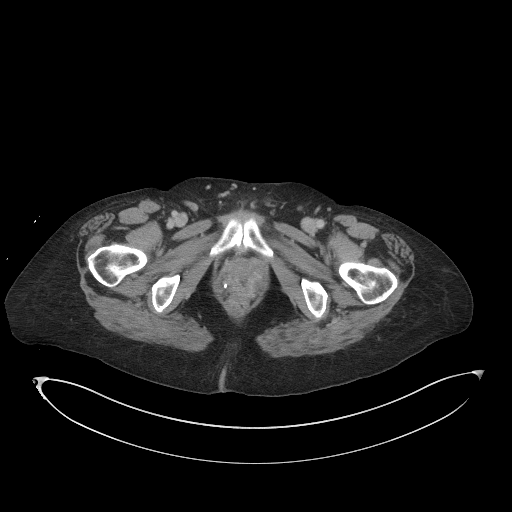
[im 25/94  soft-tissue]
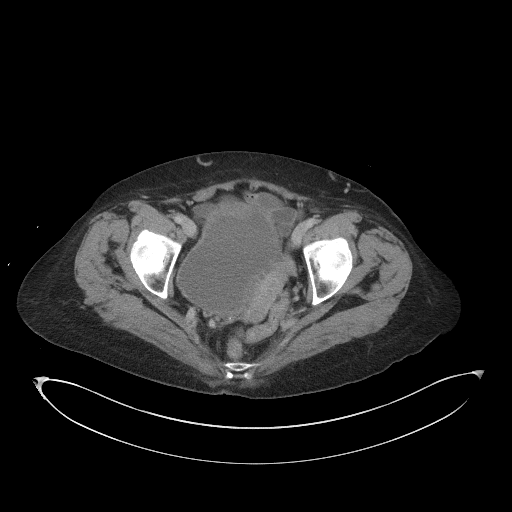
[im 30/94  soft-tissue]
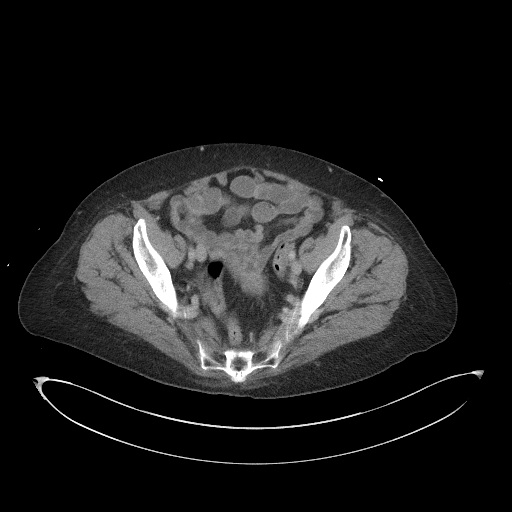
[im 40/94  soft-tissue]
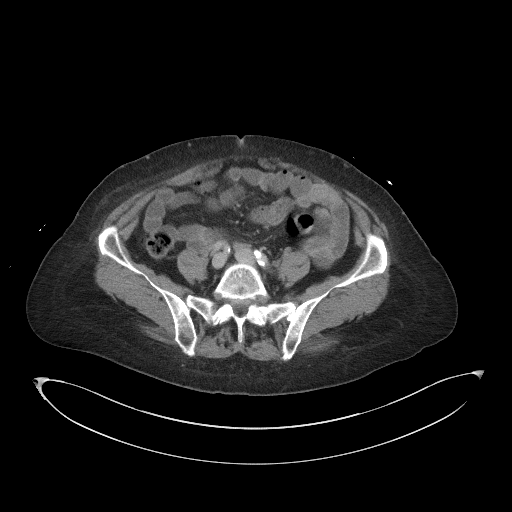
[im 49/94  soft-tissue]
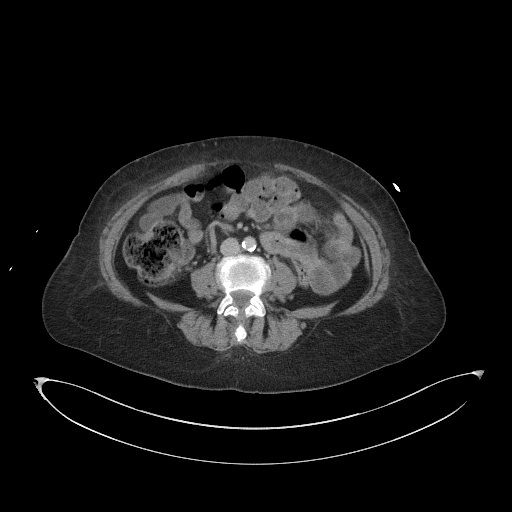
[im 54/94  soft-tissue]
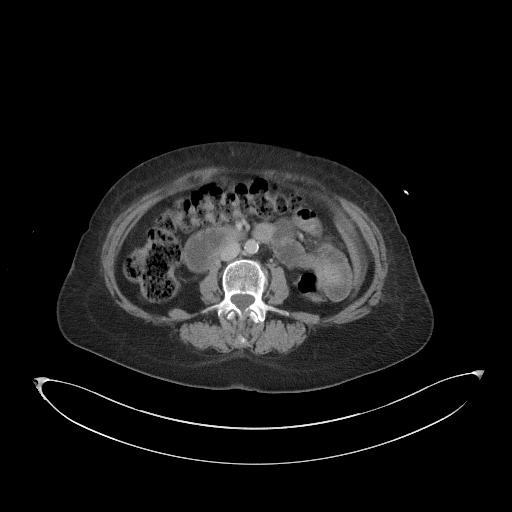
[im 64/94  soft-tissue]
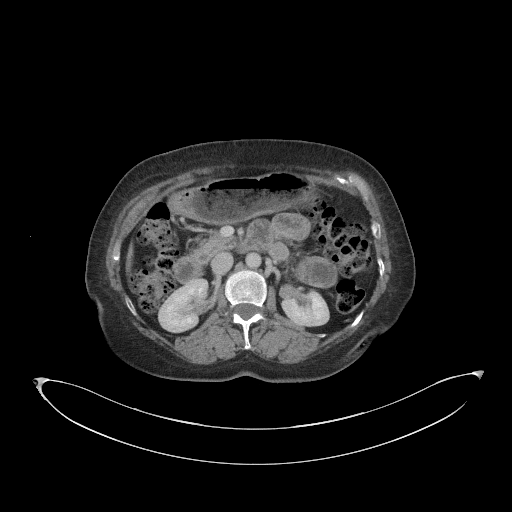
[im 69/94  soft-tissue]
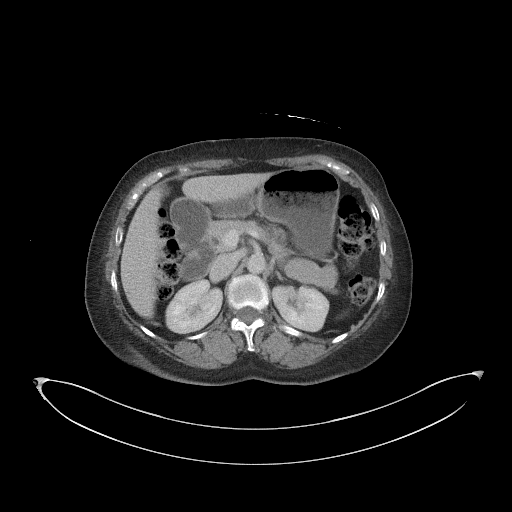
[im 69/94  bone]
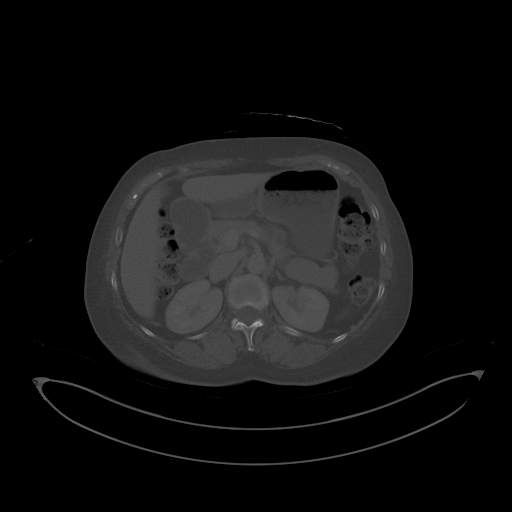
[im 79/94  soft-tissue]
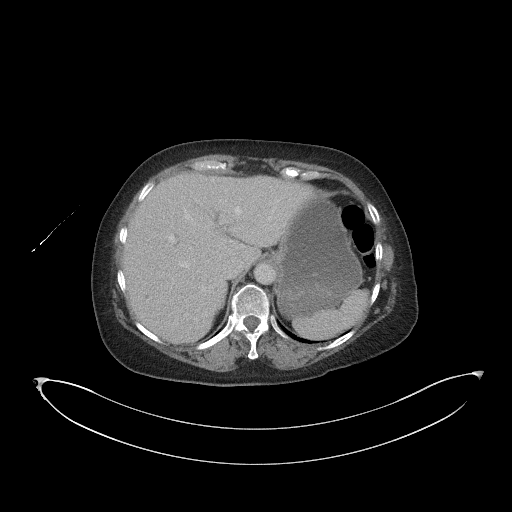
[im 89/94  soft-tissue]
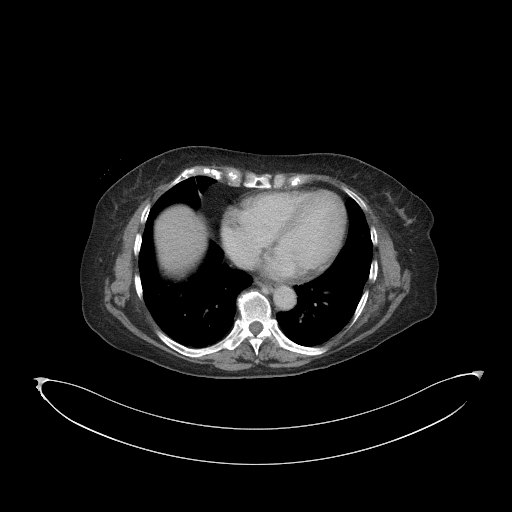

[Series 6: a/p w/ cor · coronal · 0.91mm/px · 3 of 150 slices shown]
[im 50/150  soft-tissue]
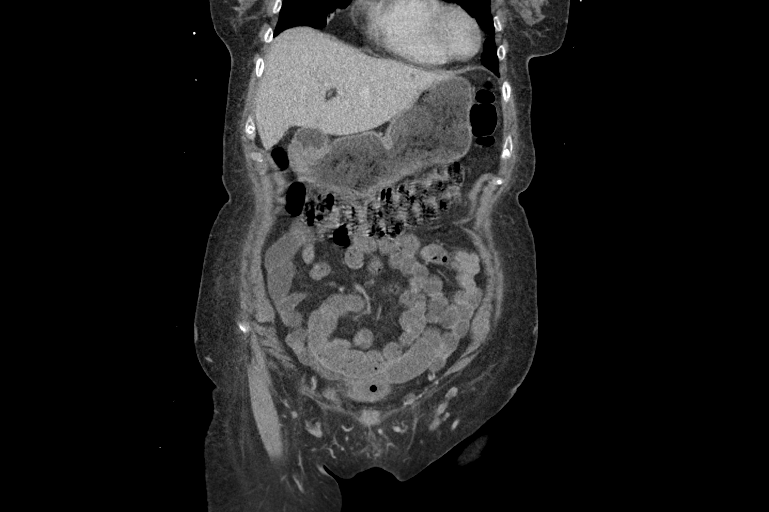
[im 67/150  soft-tissue]
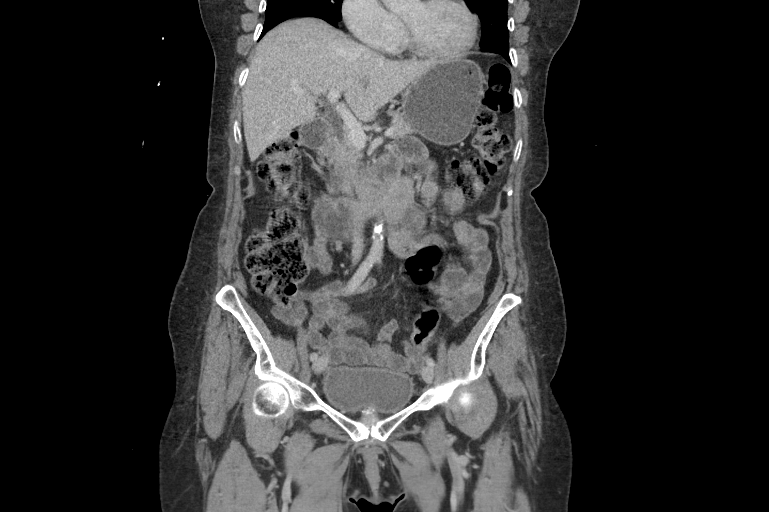
[im 83/150  soft-tissue]
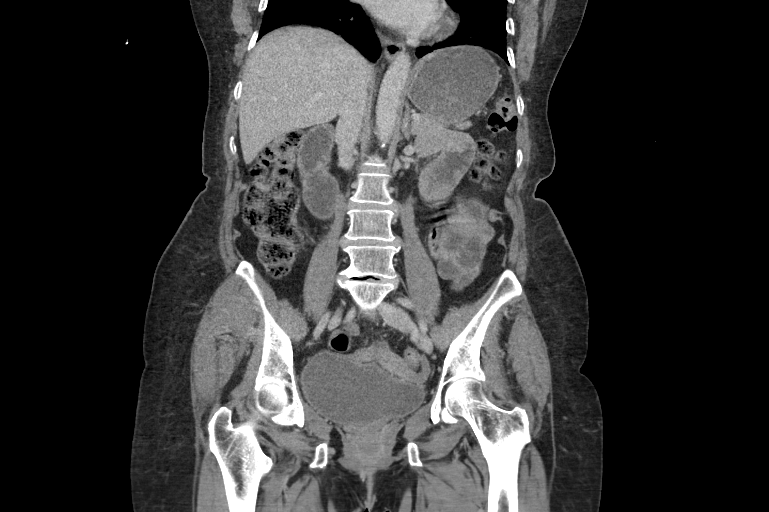

[14 of 46 positions shown; findings below may reference images not displayed]

RADIATION DOSE REDUCTION: This exam was performed according to the
departmental dose-optimization program which includes automated
exposure control, adjustment of the mA and/or kV according to
patient size and/or use of iterative reconstruction technique.

CONTRAST:  75mL OMNIPAQUE IOHEXOL 350 MG/ML SOLN
FINDINGS: CTA CHEST FINDINGS

Cardiovascular: Satisfactory opacification of the pulmonary arteries
to the segmental level. No evidence of pulmonary embolism. Normal
heart size. No pericardial effusion.

Mediastinum/Nodes: No enlarged mediastinal, hilar, or axillary lymph
nodes. Thyroid gland, trachea, and esophagus demonstrate no
significant findings.

Lungs/Pleura: There are minimal atelectatic changes in the lung
bases. The lungs are otherwise clear. There is no pleural effusion
or pneumothorax. Trachea and central airways are patent.

Musculoskeletal: No chest wall abnormality. No acute or significant
osseous findings.

Review of the MIP images confirms the above findings.

CT ABDOMEN and PELVIS FINDINGS

Hepatobiliary: There are rounded hypodensities in the liver which
are too small to characterize, possibly small cysts or hemangiomas.
There is a 14 mm cyst in the left lobe. No biliary ductal
dilatation. Gallbladder is not visualized.

Pancreas: Unremarkable. No pancreatic ductal dilatation or
surrounding inflammatory changes.

Spleen: Normal in size without focal abnormality.

Adrenals/Urinary Tract: The bladder is moderately distended. There
is a small amount of air in the bladder. Kidneys and adrenal glands
are within normal limits.

Stomach/Bowel: There is moderate distention of the stomach. There is
also fluid distention of the duodenum which is mildly dilated
measuring up to 3.5 cm. Otherwise, small bowel loops are nondilated,
but are predominantly fluid-filled. No focal inflammation. Colon is
within normal limits. Appendix is not seen.

Vascular/Lymphatic: Aortic atherosclerosis. No enlarged abdominal or
pelvic lymph nodes.

Reproductive: Uterus and bilateral adnexa are unremarkable.

Other: No abdominal wall hernia or abnormality. No abdominopelvic
ascites.

Musculoskeletal: Degenerative changes affect the spine.

Review of the MIP images confirms the above findings.
IMPRESSION: 1. No evidence for pulmonary embolism.
2. No acute cardiopulmonary process.
3. There is fluid distention of the stomach, duodenum and small
bowel which can be seen with gastroenteritis.
4. Small amount of air in the bladder. Correlate clinically for
infection.
5.  Aortic Atherosclerosis ([id number]).

## 2023-08-02 ENCOUNTER — Ambulatory Visit (HOSPITAL_COMMUNITY)
Admission: EM | Admit: 2023-08-02 | Discharge: 2023-08-02 | Disposition: A | Discharge status: Home / Self Care | Payer: Self-pay | Attending: Emergency Medicine | Admitting: Emergency Medicine

## 2023-08-02 ENCOUNTER — Encounter (HOSPITAL_COMMUNITY): Payer: Self-pay

## 2023-08-02 DIAGNOSIS — N761 Subacute and chronic vaginitis: Secondary | ICD-10-CM | POA: Insufficient documentation

## 2023-08-02 LAB — POCT URINALYSIS DIP (MANUAL ENTRY)
Bilirubin, UA: NEGATIVE
Blood, UA: NEGATIVE
Glucose, UA: NEGATIVE mg/dL
Ketones, POC UA: NEGATIVE mg/dL
Leukocytes, UA: NEGATIVE
Nitrite, UA: NEGATIVE
Protein Ur, POC: NEGATIVE mg/dL
Spec Grav, UA: 1.015 (ref 1.010–1.025)
Urobilinogen, UA: 0.2 E.U./dL
pH, UA: 7 (ref 5.0–8.0)

## 2023-08-02 MED ORDER — FLUCONAZOLE 150 MG PO TABS: ORAL_TABLET | ORAL | 0 refills | Status: DC

## 2023-08-02 NOTE — ED Provider Notes (Signed)
[facility]    CSN: [id number] Arrival date & time: 08/02/23  1324      History   Chief Complaint Chief Complaint  Patient presents with   Vaginal Itching    HPI Nereyda Westermeyer is a 77 y.o. female.   Patient presents with concerns of vaginal itching and irritation as well as discharge. She reports she has had the symptoms for over a year and they seem to be getting worse. 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 She has discharge which she thinks is coming from her vagina and states lately it has looked somewhat yellow. The patient states her main concern is the itching as the whole area is very itchy. She has been using Vagisil cream with only temporary relief. She has not seen anyone for this and has not tried OTC yeast infection creams. The patient denies dysuria, urinary frequency or urgency, abdominal or back pain, n/v, or fever.   The history is provided by the patient. A language interpreter was used.  Vaginal Itching Pertinent negatives include no abdominal pain.    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 No pertinent surgical history.  OB History     Gravida  3   Para  3   Term  3   Preterm      AB      Living  3      SAB      IAB      Ectopic      Multiple      Live Births               Home Medications    Prior to Admission medications   Medication Sig Start Date End Date Taking? Authorizing Provider  fluconazole (DIFLUCAN) 150 MG tablet Take one tablet now. Take the second tablet if still having symptoms after 72 hours. 08/02/23  Yes Zalayah Pizzuto L, PA  aspirin 325 MG tablet Take 325 mg by mouth daily.    [provider]   ibuprofen (ADVIL) 100 MG tablet Take 100 mg by mouth every 6 (six) hours as needed for fever.    [provider]  Nutritional Supplements (COLD AND FLU PO) Take 1 tablet by mouth every 6 (six) hours as needed (cough).    [provider]    Family History Family History  Problem Relation Age of Onset   Asthma Mother    Alcohol abuse Father    Heart disease Maternal Grandmother     Social History Social History   Tobacco Use   Smoking status: Former    Current packs/day: 0.00    Types: Cigarettes    Quit date: 07/24/1999    Years since quitting: 24.0  Substance Use Topics   Alcohol use: No   Drug use: No     Allergies   Patient has no known allergies.   Review of Systems Review of Systems  Constitutional:  Negative for fatigue and fever.  Gastrointestinal:  Negative for abdominal pain, nausea and vomiting.  Genitourinary:  Positive for vaginal discharge. Negative for dysuria, flank pain, frequency, hematuria, urgency and vaginal pain.  Itching  Musculoskeletal:  Negative for back pain.  Skin:  Negative for rash.     Physical Exam Triage Vital Signs ED Triage Vitals  Encounter Vitals Group     BP 08/02/23 1601 120/62     Systolic BP Percentile --      Diastolic BP Percentile --      Pulse Rate 08/02/23 1601 66     Resp 08/02/23 1601 20     Temp 08/02/23 1601 98 F (36.7 C)     Temp Source 08/02/23 1601 Oral     SpO2 08/02/23 1601 96 %     Weight --      Height --      Head Circumference --      Peak Flow --      Pain Score 08/02/23 1602 0     Pain Loc --      Pain Education --      Exclude from Growth Chart --    No data found.  Updated Vital Signs BP 120/62 (BP Location: Left Arm)   Pulse 66   Temp 98 F (36.7 C) (Oral)   Resp 20   SpO2 96%   Visual Acuity Right Eye Distance:   Left Eye Distance:   Bilateral Distance:    Right Eye Near:   Left Eye Near:    Bilateral Near:     Physical Exam Vitals and nursing  note reviewed.  Genitourinary:    Comments: Pt declines     UC Treatments / Results  Labs (all labs ordered are listed, but only abnormal results are displayed) Labs Reviewed  POCT URINALYSIS DIP (MANUAL ENTRY) - Abnormal; Notable for the following components:      Result Value   Color, UA yellow (*)    Clarity, UA cloudy (*)    All other components within normal limits  CERVICOVAGINAL ANCILLARY ONLY    EKG   Radiology No results found.  Procedures Procedures (including critical care time)  Medications Ordered in UC Medications - No data to display  Initial Impression / Assessment and Plan / UC Course  I have reviewed the triage vital signs and the nursing notes.  Pertinent labs & imaging results that were available during my care of the patient were reviewed by me and considered in my medical decision making (see chart for details).     No s/s consistent with UTI and neg U/A. Given itching and discharge, most likely cause is yeast infection or chronic irritation secondary to prolapse/cystocele. Will empirically treat while awaiting swab results. Discussed importance of follow-up for "dropped bladder" especially if itching persists.   E/M: 1 chronic illness, 2 data (UA, swab), moderate risk due to prescription management  Final Clinical Impressions(s) / UC Diagnoses   Final diagnoses:  Chronic vaginitis     Discharge Instructions      Take the medication as prescribed to treat yeast infection - take one tablet now, and take the second tablet Monday night if you are still having symptoms. We will contact you with lab results. Follow-up with PCP or gynecologist if persistent symptoms and negative testing.   Tome el medicamento segn lo recetado para tratar la candidiasis: tome una tableta ahora y tome la segunda tableta el lunes por la noche si an tiene sntomas. Nos comunicaremos con usted con los [employer]. Seguimiento con PCP o gineclogo si los  sntomas persisten y las pruebas son negativas.     ED Prescriptions     Medication  Sig Dispense Auth. Provider   fluconazole (DIFLUCAN) 150 MG tablet Take one tablet now. Take the second tablet if still having symptoms after 72 hours. 2 tablet Vallery Sa, Kaushal Vannice L, PA      PDMP not reviewed this encounter.   Estanislado Pandy, [place] 08/02/23 1728

## 2023-08-02 NOTE — Discharge Instructions (Signed)
Take the medication as prescribed to treat yeast infection - take one tablet now, and take the second tablet Monday night if you are still having symptoms. We will contact you with lab results. Follow-up with PCP or gynecologist if persistent symptoms and negative testing.   Tome el medicamento segn lo recetado para tratar la candidiasis: tome una tableta ahora y tome la segunda tableta el lunes por la noche si an tiene sntomas. Nos comunicaremos con usted con los [employer]. Seguimiento con PCP o gineclogo si los sntomas persisten y las pruebas son negativas.

## 2023-08-02 NOTE — ED Triage Notes (Signed)
Per interpretor, pt c/o vaginal itchy for over a year and has a dropped bladder. States has vaginal discharge or from the bladder.

## 2023-08-05 LAB — CERVICOVAGINAL ANCILLARY ONLY
Bacterial Vaginitis (gardnerella): POSITIVE — AB
Candida Glabrata: NEGATIVE
Candida Vaginitis: NEGATIVE
Chlamydia: NEGATIVE
Comment: NEGATIVE
Comment: NEGATIVE
Comment: NEGATIVE
Comment: NEGATIVE
Comment: NEGATIVE
Comment: NORMAL
Neisseria Gonorrhea: NEGATIVE
Trichomonas: NEGATIVE

## 2023-08-06 ENCOUNTER — Telehealth (HOSPITAL_COMMUNITY): Payer: Self-pay | Admitting: Emergency Medicine

## 2023-08-06 MED ORDER — METRONIDAZOLE 500 MG PO TABS: 500.0000 mg | ORAL_TABLET | Freq: Two times a day (BID) | ORAL | 0 refills | Status: DC

## 2023-08-06 NOTE — Telephone Encounter (Signed)
Metronidazole for positive BV

## 2023-08-09 ENCOUNTER — Telehealth: Payer: Self-pay

## 2024-03-13 ENCOUNTER — Other Ambulatory Visit: Payer: Self-pay | Admitting: Obstetrics and Gynecology

## 2024-03-13 ENCOUNTER — Telehealth: Payer: Self-pay

## 2024-03-13 DIAGNOSIS — Z1231 Encounter for screening mammogram for malignant neoplasm of breast: Secondary | ICD-10-CM

## 2024-03-13 NOTE — Telephone Encounter (Signed)
 Telephoned patient at both numbers, using interpreter, Tiffany Armstrong. No answer or voice mail. [facility] (scholarship, mobile event)

## 2024-04-28 ENCOUNTER — Encounter (HOSPITAL_COMMUNITY): Payer: Self-pay

## 2024-04-28 ENCOUNTER — Ambulatory Visit (HOSPITAL_COMMUNITY)
Admission: EM | Admit: 2024-04-28 | Discharge: 2024-04-28 | Disposition: A | Discharge status: Home / Self Care | Payer: Self-pay | Attending: Emergency Medicine | Admitting: Emergency Medicine

## 2024-04-28 DIAGNOSIS — N761 Subacute and chronic vaginitis: Secondary | ICD-10-CM | POA: Insufficient documentation

## 2024-04-28 DIAGNOSIS — W57XXXA Bitten or stung by nonvenomous insect and other nonvenomous arthropods, initial encounter: Secondary | ICD-10-CM | POA: Insufficient documentation

## 2024-04-28 DIAGNOSIS — B9689 Other specified bacterial agents as the cause of diseases classified elsewhere: Secondary | ICD-10-CM | POA: Insufficient documentation

## 2024-04-28 DIAGNOSIS — S80861A Insect bite (nonvenomous), right lower leg, initial encounter: Secondary | ICD-10-CM | POA: Insufficient documentation

## 2024-04-28 DIAGNOSIS — N898 Other specified noninflammatory disorders of vagina: Secondary | ICD-10-CM

## 2024-04-28 MED ORDER — FLUCONAZOLE 150 MG PO TABS: ORAL_TABLET | ORAL | 0 refills | Status: DC

## 2024-04-28 MED ORDER — DOXYCYCLINE HYCLATE 100 MG PO CAPS: 100.0000 mg | ORAL_CAPSULE | Freq: Two times a day (BID) | ORAL | 0 refills | Status: DC

## 2024-04-28 NOTE — ED Triage Notes (Addendum)
 Pt states via interpretor that 3 weeks ago she was bit by a tick on her right ankle. Pt also states she is having vaginal itching for the past 2 years.  States she has been using vagisil at home but it is not working anymore.

## 2024-04-28 NOTE — ED Provider Notes (Signed)
 [facility]    CSN: [id number] Arrival date & time: 04/28/24  1306      History   Chief Complaint Chief Complaint  Patient presents with   Insect Bite    HPI Tiffany Armstrong is a 78 y.o. female.   Patient presents with concerns about tick bite to her right lower leg that occurred 3 weeks ago.  Patient states that she did remove the tick right away.  Patient states that she has noticed some increased redness around the site of the bite.  Denies swelling, purulent drainage, fever, fatigue, weakness, body aches, and confusion.  Patient also reports persistent vaginal itching x 2 years.  Patient states that she has history of this in the past.  Patient states that she has been using over-the-counter Vagisil cream without relief.  Patient denies any obvious increased vaginal discharge.  Patient has been seen here in the past for same and ultimately did test positive for BV.  Patient denies having a gynecologist.  Patient denies any recent unprotected sexual intercourse.  Patient is postmenopausal.  The history is provided by the patient and medical records. The history is limited by a language barrier. A language interpreter was used (Spanish interpreter).    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 No pertinent surgical history.  OB History     Gravida  3   Para  3   Term  3   Preterm      AB      Living  3      SAB      IAB      Ectopic      Multiple      Live Births               Home Medications    Prior to Admission medications   Medication Sig Start Date End Date Taking? Authorizing Provider  doxycycline (VIBRAMYCIN) 100 MG capsule Take 1 capsule (100 mg total) by mouth 2 (two) times  daily. 04/28/24  Yes Levora Reas A, NP  fluconazole  (DIFLUCAN ) 150 MG tablet Take one tablet today and one tablet in 3 days if symptoms persist. 04/28/24  Yes Levora Reas A, NP  aspirin 325 MG tablet Take 325 mg by mouth daily.    [provider]  ibuprofen  (ADVIL ) 100 MG tablet Take 100 mg by mouth every 6 (six) hours as needed for fever.    [provider]  Nutritional Supplements (COLD AND FLU PO) Take 1 tablet by mouth every 6 (six) hours as needed (cough).    [provider]    Family History Family History  Problem Relation Age of Onset   Asthma Mother    Alcohol abuse Father    Heart disease Maternal Grandmother     Social History Social History   Tobacco Use   Smoking status: Former    Current packs/day: 0.00    Types: Cigarettes    Quit date: 07/24/1999    Years since quitting: 24.7  Substance Use Topics   Alcohol use: No   Drug use: No     Allergies   Patient has no known allergies.   Review of Systems Review of Systems  Per HPI  Physical Exam Triage Vital Signs ED Triage Vitals  Encounter Vitals Group     BP 04/28/24 1449 (!) 131/57     Systolic BP Percentile --      Diastolic BP Percentile --      Pulse Rate 04/28/24 1449 64     Resp 04/28/24 1449 16     Temp 04/28/24 1449 97.8 F (36.6 C)     Temp Source 04/28/24 1449 Oral     SpO2 04/28/24 1449 94 %     Weight --      Height --      Head Circumference --      Peak Flow --      Pain Score 04/28/24 1453 0     Pain Loc --      Pain Education --      Exclude from Growth Chart --    No data found.  Updated Vital Signs BP (!) 131/57 (BP Location: Left Arm)   Pulse 64   Temp 97.8 F (36.6 C) (Oral)   Resp 16   SpO2 94%   Visual Acuity Right Eye Distance:   Left Eye Distance:   Bilateral Distance:    Right Eye Near:   Left Eye Near:    Bilateral Near:     Physical Exam Vitals and nursing note reviewed.  Constitutional:      General: She is  awake. She is not in acute distress.    Appearance: Normal appearance. She is well-developed and well-groomed. She is not ill-appearing.  Genitourinary:    Comments: Exam deferred Skin:    General: Skin is warm and dry.     Findings: Erythema and wound present.       Neurological:     Mental Status: She is alert.  Psychiatric:        Behavior: Behavior is cooperative.      UC Treatments / Results  Labs (all labs ordered are listed, but only abnormal results are displayed) Labs Reviewed  CERVICOVAGINAL ANCILLARY ONLY    EKG   Radiology No results found.  Procedures Procedures (including critical care time)  Medications Ordered in UC Medications - No data to display  Initial Impression / Assessment and Plan / UC Course  I have reviewed the triage vital signs and the nursing notes.  Pertinent labs & imaging results that were available during my care of the patient were reviewed by me and considered in my medical decision making (see chart for details).     Patient is well-appearing.  Vitals are stable.  Upon assessment there is an approximately 2 1/2 cm in diameter area of erythema with a black center to the anterior medial aspect of the right lower leg.  There is no drainage, swelling, or warmth noted.  There is no tick noted on exam either.  Empirically treating for Lyme disease with doxycycline.  GU exam deferred.  Patient perform self swab for BV and yeast.  Empirically treating for yeast with Diflucan .  Given resources to get established with a GYN.  Discussed follow-up and return precautions. Final Clinical Impressions(s) / UC Diagnoses   Final diagnoses:  Tick bite of right lower leg, initial encounter  Vaginal itching  Chronic vaginitis     Discharge Instructions      Start taking doxycycline twice daily for 10 days for tick bite.  If you develop fever, body aches, fatigue, or weakness return here for reevaluation. Take 1 tablet of Diflucan  today and  another  tablet in 3 days if you continue to have vaginal itching. Your swab results will return over the next couple days and someone will call and adjust the treatment plan if needed. [facility]up with [facility] to get established with a gynecologist regarding your chronic vaginal concerns. Return here as needed.  Comience a tomar [employer] al da durante [address] para la picadura de garrapata. Si presenta fiebre, dolores corporales, fatiga o debilidad, regrese aqu para una reevaluacin. Tome una tableta de Diflucan  hoy y otra tableta en 3 das si contina con picazn vaginal. Los resultados de su hisopado estarn disponibles en los prximos das y alguien lo llamar para Mining engineer plan de tratamiento si es necesario. Las mujeres deben programar una cita de seguimiento con [facility] con un gineclogo sobre sus problemas vaginales crnicos. Regrese aqu segn sea necesario.  ED Prescriptions     Medication Sig Dispense Auth. Provider   doxycycline (VIBRAMYCIN) 100 MG capsule Take 1 capsule (100 mg total) by mouth 2 (two) times daily. 20 capsule Levora Reas A, NP   fluconazole  (DIFLUCAN ) 150 MG tablet Take one tablet today and one tablet in 3 days if symptoms persist. 2 tablet Levora Reas A, NP      PDMP not reviewed this encounter.   Levora Reas A, NP 04/28/24 1601

## 2024-04-28 NOTE — Discharge Instructions (Addendum)
 Start taking doxycycline twice daily for 10 days for tick bite.  If you develop fever, body aches, fatigue, or weakness return here for reevaluation. Take 1 tablet of Diflucan  today and another tablet in 3 days if you continue to have vaginal itching. Your swab results will return over the next couple days and someone will call and adjust the treatment plan if needed. Follow-up with [facility] to get established with a gynecologist regarding your chronic vaginal concerns. Return here as needed.  Comience a tomar [employer] al da durante [address] para la picadura de garrapata. Si presenta fiebre, dolores corporales, fatiga o debilidad, regrese aqu para una reevaluacin. Tome una tableta de Diflucan  hoy y otra tableta en 3 das si contina con picazn vaginal. Los resultados de su hisopado estarn disponibles en los prximos das y alguien lo llamar para Mining engineer plan de tratamiento si es necesario. Las mujeres deben programar una cita de seguimiento con [facility] con un gineclogo sobre sus problemas vaginales crnicos. Regrese aqu segn sea necesario.

## 2024-04-29 LAB — CERVICOVAGINAL ANCILLARY ONLY
Bacterial Vaginitis (gardnerella): POSITIVE — AB
Candida Glabrata: NEGATIVE
Candida Vaginitis: NEGATIVE
Comment: NEGATIVE
Comment: NEGATIVE
Comment: NEGATIVE

## 2024-04-30 ENCOUNTER — Ambulatory Visit (HOSPITAL_COMMUNITY): Payer: Self-pay

## 2024-04-30 MED ORDER — METRONIDAZOLE 500 MG PO TABS: 500.0000 mg | ORAL_TABLET | Freq: Two times a day (BID) | ORAL | 0 refills | Status: AC

## 2024-06-02 ENCOUNTER — Ambulatory Visit (HOSPITAL_COMMUNITY)
Admission: EM | Admit: 2024-06-02 | Discharge: 2024-06-02 | Disposition: A | Discharge status: Home / Self Care | Payer: Self-pay | Attending: Family Medicine | Admitting: Family Medicine

## 2024-06-02 ENCOUNTER — Encounter (HOSPITAL_COMMUNITY): Payer: Self-pay

## 2024-06-02 DIAGNOSIS — N898 Other specified noninflammatory disorders of vagina: Secondary | ICD-10-CM | POA: Insufficient documentation

## 2024-06-02 DIAGNOSIS — N760 Acute vaginitis: Secondary | ICD-10-CM | POA: Insufficient documentation

## 2024-06-02 DIAGNOSIS — B9689 Other specified bacterial agents as the cause of diseases classified elsewhere: Secondary | ICD-10-CM | POA: Insufficient documentation

## 2024-06-02 NOTE — Discharge Instructions (Addendum)
We have sent testing for various causes of vaginal infections. We will notify you of any positive results once they are received. If required, we will prescribe any medications you might need.  Please refrain from all sexual activity for at least the next seven days.  

## 2024-06-02 NOTE — ED Triage Notes (Signed)
 Patient here today with c/o vaginal itching X 6 months. Patient was here in May and tested positive for BV. Patient never picked up the medication prescribed.

## 2024-06-03 LAB — CERVICOVAGINAL ANCILLARY ONLY
Bacterial Vaginitis (gardnerella): POSITIVE — AB
Candida Glabrata: NEGATIVE
Candida Vaginitis: NEGATIVE
Chlamydia: NEGATIVE
Comment: NEGATIVE
Comment: NEGATIVE
Comment: NEGATIVE
Comment: NEGATIVE
Comment: NEGATIVE
Comment: NORMAL
Neisseria Gonorrhea: NEGATIVE
Trichomonas: NEGATIVE

## 2024-06-04 ENCOUNTER — Ambulatory Visit: Payer: Self-pay

## 2024-06-06 NOTE — ED Provider Notes (Signed)
  [facility]   [id number] 06/02/24 Arrival Time: 1456  ASSESSMENT & PLAN:  1. Vaginal irritation   2. Bacterial vaginosis    No empiric tx.    Discharge Instructions      We have sent testing for various causes of vaginal infections. We will notify you of any positive results once they are received. If required, we will prescribe any medications you might need.  Please refrain from all sexual activity for at least the next seven days.     Without s/s of PID.  Vaginal cytology pending. Will notify of any positive results. Instructed to refrain from sexual activity for at least seven days.  Reviewed expectations re: course of current medical issues. Questions answered. Outlined signs and symptoms indicating need for more acute intervention. Patient verbalized understanding. After Visit Summary given.   SUBJECTIVE:  Tiffany Armstrong is a 78 y.o. female who presents with complaint of vaginal discharge/itching; x 6 mos. Tested + BV in 5/25. Did not pick up medication. Otherwise well.  No LMP recorded. Patient is postmenopausal.   OBJECTIVE:  Vitals:   06/02/24 1548  BP: 124/64  Pulse: 66  Resp: 16  Temp: 98.5 F (36.9 C)  TempSrc: Oral  SpO2: 97%    General appearance: alert, cooperative, appears stated age and no distress Lungs: unlabored respirations; speaks full sentences without difficulty Back: no CVA tenderness; FROM at waist Abdomen: soft, non-tender GU: deferred Skin: warm and dry Psychological: alert and cooperative; normal mood and affect.   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   Colpo 04/17/12   Cystocele 04/17/2012   Family History  Problem Relation Age of Onset   Asthma Mother    Alcohol abuse Father    Heart disease Maternal Grandmother    Social History   Socioeconomic History   Marital status: Single    Spouse name: Not on file   Number of children: Not  on file   Years of education: Not on file   Highest education level: Not on file  Occupational History   Not on file  Tobacco Use   Smoking status: Former    Current packs/day: 0.00    Types: Cigarettes    Quit date: 07/24/1999    Years since quitting: 24.8   Smokeless tobacco: Not on file  Substance and Sexual Activity   Alcohol use: No   Drug use: No   Sexual activity: Not Currently    Birth control/protection: None  Other Topics Concern   Not on file  Social History Narrative   Not on file   Social Drivers of Health   Financial Resource Strain: Not on file  Food Insecurity: Not on file  Transportation Needs: Not on file  Physical Activity: Not on file  Stress: Not on file  Social Connections: Not on file  Intimate Partner Violence: Not on file           Rolinda Rogue, MD 06/06/24 1008

## 2024-06-08 ENCOUNTER — Ambulatory Visit (HOSPITAL_COMMUNITY): Payer: Self-pay

## 2024-06-08 MED ORDER — METRONIDAZOLE 500 MG PO TABS: 500.0000 mg | ORAL_TABLET | Freq: Two times a day (BID) | ORAL | 0 refills | Status: AC

## 2024-06-16 ENCOUNTER — Telehealth (HOSPITAL_COMMUNITY): Payer: Self-pay

## 2024-06-16 ENCOUNTER — Ambulatory Visit (HOSPITAL_COMMUNITY)
Admission: EM | Admit: 2024-06-16 | Discharge: 2024-06-16 | Disposition: A | Discharge status: Home / Self Care | Payer: Self-pay

## 2024-06-16 NOTE — Telephone Encounter (Signed)
 Patient came into the office today to discuss the medication that she was prescribed. Patient initially stated that she was not given Metronidazole  but something else. I called the pharmacy and confirmed that she was given the correct medication. I went back into the room to inform the patient and she started to apologize as she had someone take a picture of the medication that she had at home and it was correct. Patient states that she is feeling better.

## 2024-09-10 ENCOUNTER — Ambulatory Visit: Payer: Self-pay
